# Patient Record
Sex: Female | Born: 1985 | Race: Black or African American | Hispanic: No | Marital: Married | Smoking: Never smoker
Health system: Southern US, Community
[De-identification: ages and names within clinical notes are randomized; demographics above are authoritative.]

## PROBLEM LIST (undated history)

## (undated) ENCOUNTER — Inpatient Hospital Stay: Payer: Self-pay

## (undated) ENCOUNTER — Inpatient Hospital Stay (HOSPITAL_COMMUNITY): Payer: Self-pay

## (undated) DIAGNOSIS — R87619 Unspecified abnormal cytological findings in specimens from cervix uteri: Secondary | ICD-10-CM

## (undated) DIAGNOSIS — R7611 Nonspecific reaction to tuberculin skin test without active tuberculosis: Secondary | ICD-10-CM

## (undated) DIAGNOSIS — J45909 Unspecified asthma, uncomplicated: Secondary | ICD-10-CM

## (undated) HISTORY — DX: Nonspecific reaction to tuberculin skin test without active tuberculosis: R76.11

## (undated) HISTORY — PX: FOOT SURGERY: SHX648

## (undated) HISTORY — DX: Unspecified abnormal cytological findings in specimens from cervix uteri: R87.619

## (undated) HISTORY — PX: NO PAST SURGERIES: SHX2092

---
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
## 2004-11-23 ENCOUNTER — Emergency Department: Payer: Self-pay | Admitting: Emergency Medicine

## 2005-11-27 ENCOUNTER — Emergency Department: Payer: Self-pay | Admitting: Emergency Medicine

## 2006-09-28 ENCOUNTER — Emergency Department: Payer: Self-pay | Admitting: Internal Medicine

## 2006-10-07 DIAGNOSIS — R7611 Nonspecific reaction to tuberculin skin test without active tuberculosis: Secondary | ICD-10-CM

## 2006-10-07 HISTORY — DX: Nonspecific reaction to tuberculin skin test without active tuberculosis: R76.11

## 2006-10-25 ENCOUNTER — Emergency Department: Payer: Self-pay | Admitting: Emergency Medicine

## 2007-03-17 ENCOUNTER — Ambulatory Visit (HOSPITAL_COMMUNITY): Admission: RE | Admit: 2007-03-17 | Discharge: 2007-03-17 | Payer: Self-pay | Admitting: Family Medicine

## 2007-04-08 ENCOUNTER — Observation Stay: Payer: Self-pay | Admitting: Obstetrics and Gynecology

## 2007-05-04 ENCOUNTER — Observation Stay: Payer: Self-pay

## 2007-06-08 ENCOUNTER — Observation Stay: Payer: Self-pay | Admitting: Obstetrics and Gynecology

## 2007-06-09 ENCOUNTER — Observation Stay: Payer: Self-pay | Admitting: Obstetrics and Gynecology

## 2007-06-10 ENCOUNTER — Observation Stay: Payer: Self-pay

## 2007-07-24 ENCOUNTER — Observation Stay: Payer: Self-pay

## 2007-08-13 ENCOUNTER — Observation Stay: Payer: Self-pay | Admitting: Obstetrics and Gynecology

## 2007-08-14 ENCOUNTER — Inpatient Hospital Stay: Payer: Self-pay | Admitting: Unknown Physician Specialty

## 2007-11-07 ENCOUNTER — Emergency Department: Payer: Self-pay | Admitting: Emergency Medicine

## 2008-09-14 ENCOUNTER — Emergency Department: Payer: Self-pay | Admitting: Emergency Medicine

## 2008-12-19 ENCOUNTER — Ambulatory Visit: Payer: Self-pay | Admitting: Family Medicine

## 2009-03-09 ENCOUNTER — Emergency Department: Payer: Self-pay | Admitting: Internal Medicine

## 2009-04-28 ENCOUNTER — Emergency Department: Payer: Self-pay | Admitting: Emergency Medicine

## 2009-10-17 ENCOUNTER — Emergency Department: Payer: Self-pay | Admitting: Emergency Medicine

## 2009-10-19 ENCOUNTER — Emergency Department: Payer: Self-pay | Admitting: Emergency Medicine

## 2010-03-26 ENCOUNTER — Emergency Department: Payer: Self-pay | Admitting: Emergency Medicine

## 2010-03-29 ENCOUNTER — Ambulatory Visit: Payer: Self-pay | Admitting: Internal Medicine

## 2010-11-16 ENCOUNTER — Emergency Department: Payer: Self-pay | Admitting: Emergency Medicine

## 2010-12-26 ENCOUNTER — Emergency Department: Payer: Self-pay | Admitting: Emergency Medicine

## 2012-07-26 ENCOUNTER — Emergency Department: Payer: Self-pay | Admitting: Emergency Medicine

## 2012-08-04 ENCOUNTER — Emergency Department: Payer: Self-pay | Admitting: Emergency Medicine

## 2012-09-07 ENCOUNTER — Emergency Department: Payer: Self-pay | Admitting: Emergency Medicine

## 2012-09-07 LAB — URINALYSIS, COMPLETE
Bilirubin,UR: NEGATIVE
Nitrite: NEGATIVE
Ph: 7 (ref 4.5–8.0)
Protein: >=500
RBC,UR: 4204 /HPF (ref 0–5)
Specific Gravity: 1.023 (ref 1.003–1.030)
WBC UR: 2916 /HPF (ref 0–5)

## 2012-09-07 LAB — PREGNANCY, URINE: Pregnancy Test, Urine: NEGATIVE m[IU]/mL

## 2012-09-09 LAB — URINE CULTURE

## 2013-06-27 ENCOUNTER — Emergency Department: Payer: Self-pay | Admitting: Emergency Medicine

## 2013-06-27 LAB — CBC WITH DIFFERENTIAL/PLATELET
Basophil #: 0 10*3/uL (ref 0.0–0.1)
Basophil %: 0.7 %
Eosinophil #: 0.1 10*3/uL (ref 0.0–0.7)
Lymphocyte %: 24.6 %
Monocyte #: 0.9 x10 3/mm (ref 0.2–0.9)
Neutrophil #: 3.5 10*3/uL (ref 1.4–6.5)
Platelet: 277 10*3/uL (ref 150–440)
RBC: 3.91 10*6/uL (ref 3.80–5.20)
RDW: 13 % (ref 11.5–14.5)

## 2013-06-27 LAB — COMPREHENSIVE METABOLIC PANEL
Albumin: 3.9 g/dL (ref 3.4–5.0)
BUN: 10 mg/dL (ref 7–18)
Bilirubin,Total: 0.4 mg/dL (ref 0.2–1.0)
Calcium, Total: 8.5 mg/dL (ref 8.5–10.1)
Co2: 29 mmol/L (ref 21–32)
Creatinine: 0.71 mg/dL (ref 0.60–1.30)
EGFR (African American): >60
Glucose: 83 mg/dL (ref 65–99)
Osmolality: 278 (ref 275–301)
Potassium: 3.5 mmol/L (ref 3.5–5.1)
SGOT(AST): 37 U/L (ref 15–37)
SGPT (ALT): 56 U/L (ref 12–78)
Sodium: 140 mmol/L (ref 136–145)
Total Protein: 6.6 g/dL (ref 6.4–8.2)

## 2013-06-27 LAB — URINALYSIS, COMPLETE
Bilirubin,UR: NEGATIVE
Glucose,UR: NEGATIVE mg/dL (ref 0–75)
Ketone: NEGATIVE
Nitrite: NEGATIVE
Ph: 6 (ref 4.5–8.0)
Protein: NEGATIVE
Specific Gravity: 1.025 (ref 1.003–1.030)
Squamous Epithelial: 17
WBC UR: 4 /HPF (ref 0–5)

## 2013-09-15 LAB — OB RESULTS CONSOLE HIV ANTIBODY (ROUTINE TESTING): HIV: NONREACTIVE

## 2013-09-15 LAB — OB RESULTS CONSOLE ANTIBODY SCREEN: Antibody Screen: NEGATIVE

## 2013-09-15 LAB — OB RESULTS CONSOLE HGB/HCT, BLOOD
HEMATOCRIT: 40 %
Hemoglobin: 13.6 g/dL

## 2013-09-15 LAB — OB RESULTS CONSOLE GC/CHLAMYDIA
CHLAMYDIA, DNA PROBE: NEGATIVE
GC PROBE AMP, GENITAL: NEGATIVE

## 2013-09-15 LAB — OB RESULTS CONSOLE HEPATITIS B SURFACE ANTIGEN: Hepatitis B Surface Ag: NEGATIVE

## 2013-09-15 LAB — OB RESULTS CONSOLE ABO/RH: RH Type: POSITIVE

## 2013-09-15 LAB — OB RESULTS CONSOLE VARICELLA ZOSTER ANTIBODY, IGG: VARICELLA IGG: IMMUNE

## 2013-09-15 LAB — OB RESULTS CONSOLE RUBELLA ANTIBODY, IGM: Rubella: IMMUNE

## 2013-09-15 LAB — OB RESULTS CONSOLE RPR: RPR: NONREACTIVE

## 2013-10-07 NOTE — L&D Delivery Note (Signed)
Delivery Note At 6:29 PM a viable female was delivered via Vaginal, Spontaneous Delivery (Presentation: Right Occiput Anterior).  APGAR: 9, 10; weight TBD.   Placenta status: Intact, Spontaneous Pathology.  Cord: 3 vessels with the following complications: None.    Anesthesia: Epidural  Episiotomy: None Lacerations: None Suture Repair: na Est. Blood Loss (mL): 350cc  Mom to postpartum.  Baby to Couplet care / Skin to Skin.  Pt pushed with good maternal effort to deliver a liveborn female via NSVD with spontaneous cry.  Tight shoulders to deliver but no dystocia.  Baby placed on maternal abdomen.  Delayed cord clamping performed.  Cord cut by FOB.  Placenta delivered intact with 3V cord via traction and pitocin.  No tears. No complications.  Mom and baby to postpartum.  Meir Elwood L 04/10/2014, 6:46 PM

## 2013-10-29 ENCOUNTER — Emergency Department: Payer: Self-pay | Admitting: Emergency Medicine

## 2013-12-15 ENCOUNTER — Encounter: Payer: Self-pay | Admitting: *Deleted

## 2013-12-15 DIAGNOSIS — O09219 Supervision of pregnancy with history of pre-term labor, unspecified trimester: Principal | ICD-10-CM

## 2013-12-15 DIAGNOSIS — J45909 Unspecified asthma, uncomplicated: Secondary | ICD-10-CM

## 2013-12-15 DIAGNOSIS — N939 Abnormal uterine and vaginal bleeding, unspecified: Secondary | ICD-10-CM | POA: Insufficient documentation

## 2013-12-15 DIAGNOSIS — R87619 Unspecified abnormal cytological findings in specimens from cervix uteri: Secondary | ICD-10-CM

## 2013-12-15 DIAGNOSIS — R634 Abnormal weight loss: Secondary | ICD-10-CM | POA: Insufficient documentation

## 2013-12-15 DIAGNOSIS — Z91419 Personal history of unspecified adult abuse: Secondary | ICD-10-CM | POA: Insufficient documentation

## 2013-12-15 DIAGNOSIS — O09899 Supervision of other high risk pregnancies, unspecified trimester: Secondary | ICD-10-CM | POA: Insufficient documentation

## 2013-12-15 HISTORY — DX: Unspecified abnormal cytological findings in specimens from cervix uteri: R87.619

## 2013-12-16 ENCOUNTER — Emergency Department (HOSPITAL_COMMUNITY)
Admission: EM | Admit: 2013-12-16 | Discharge: 2013-12-16 | Disposition: A | Discharge status: Home / Self Care | Payer: Medicaid Other | Attending: Emergency Medicine | Admitting: Emergency Medicine

## 2013-12-16 ENCOUNTER — Encounter (HOSPITAL_COMMUNITY): Payer: Self-pay | Admitting: Emergency Medicine

## 2013-12-16 DIAGNOSIS — O09219 Supervision of pregnancy with history of pre-term labor, unspecified trimester: Secondary | ICD-10-CM

## 2013-12-16 DIAGNOSIS — Z79899 Other long term (current) drug therapy: Secondary | ICD-10-CM | POA: Insufficient documentation

## 2013-12-16 DIAGNOSIS — J3489 Other specified disorders of nose and nasal sinuses: Secondary | ICD-10-CM | POA: Insufficient documentation

## 2013-12-16 DIAGNOSIS — O09899 Supervision of other high risk pregnancies, unspecified trimester: Secondary | ICD-10-CM

## 2013-12-16 DIAGNOSIS — N949 Unspecified condition associated with female genital organs and menstrual cycle: Secondary | ICD-10-CM | POA: Insufficient documentation

## 2013-12-16 DIAGNOSIS — Z8751 Personal history of pre-term labor: Secondary | ICD-10-CM | POA: Insufficient documentation

## 2013-12-16 DIAGNOSIS — R143 Flatulence: Secondary | ICD-10-CM

## 2013-12-16 DIAGNOSIS — R141 Gas pain: Secondary | ICD-10-CM | POA: Insufficient documentation

## 2013-12-16 DIAGNOSIS — R142 Eructation: Secondary | ICD-10-CM | POA: Insufficient documentation

## 2013-12-16 DIAGNOSIS — O9989 Other specified diseases and conditions complicating pregnancy, childbirth and the puerperium: Secondary | ICD-10-CM | POA: Insufficient documentation

## 2013-12-16 DIAGNOSIS — Z3480 Encounter for supervision of other normal pregnancy, unspecified trimester: Secondary | ICD-10-CM | POA: Insufficient documentation

## 2013-12-16 NOTE — ED Notes (Signed)
Pt reports being approx [redacted] weeks pregnant, no prenatal care yet during this pregnancy. Reports this am had episode of pelvic pain, but has not felt any fetal movement today. Denies vaginal bleeding or discharge.

## 2013-12-16 NOTE — Discharge Instructions (Signed)
Followup with local OB/GYN [facility]. Continue your prenatal vitamins. Return for any newer worse symptoms. Here today fetal movement is good no signs of any stress or difficulty for the baby.   Emergency Department Resource Guide 1) Find a Doctor and Pay Out of Pocket Although you won't have to find out who is covered by your insurance plan, it is a good idea to ask around and get recommendations. You will then need to call the office and see if the doctor you have chosen will accept you as a new patient and what types of options they offer for patients who are self-pay. Some doctors offer discounts or will set up payment plans for their patients who do not have insurance, but you will need to ask so you aren't surprised when you get to your appointment.  2) Contact Your Local Health Department Not all health departments have doctors that can see patients for sick visits, but many do, so it is worth a call to see if yours does. If you don't know where your local health department is, you can check in your phone book. The CDC also has a tool to help you locate your state's health department, and many state websites also have listings of all of their local health departments.  3) Find a Walk-in Clinic If your illness is not likely to be very severe or complicated, you may want to try a walk in clinic. These are popping up all over the country in pharmacies, drugstores, and shopping centers. They're usually staffed by nurse practitioners or physician assistants that have been trained to treat common illnesses and complaints. They're usually fairly quick and inexpensive. However, if you have serious medical issues or chronic medical problems, these are probably not your best option.  No Primary Care Doctor: - Call Health Connect at  [phone] - they can help you locate a primary care doctor that  accepts your insurance, provides certain services, etc. - Physician Referral Service-  [phone]  Chronic Pain Problems: Organization         Address  Phone   Notes  [facility]  [phone] Patients need to be referred by their primary care doctor.   Medication Assistance: Organization         Address  Phone   Notes  [facility] Medication Revision [facility] [address] [place], [place] [address] [phone] --Must be a resident of [place] -- Must have NO insurance coverage whatsoever (no Medicaid/ Medicare, etc.) -- The pt. MUST have a primary care doctor that directs their care regularly and follows them in the community   MedAssist  [phone]   [employer]  [phone]    Agencies that provide inexpensive medical care: Organization         Address  Phone   Notes  [facility]  [phone]   [facility]    [phone]   [facility] [address] [place], [place] [address] [phone]   [facility] [address], [place] [phone]   [facility]anned Parenthood    [phone]   [facility]    [phone]   [facility]  [address],  Phone:  [phone], Fax:  [phone] Hours of Operation:  9 am - 6 pm, M-F.  Also accepts Medicaid/Medicare and self-pay.  [place]  [facility] for [place], [address], [place] Phone: [phone], Fax: [phone]. Hours of Operation:  8:30 am - 5:30 pm, M-F.  Also accepts Medicaid and self-pay.  [facility] [place] [address] [place] Phone: [phone]   [place], [place], [place] [phone], Ext. [phone] Mondays & Thursdays: 7-9 AM.  First 15 patients are seen on a first come, first serve basis.    [place] Providers:  Organization         Address  Phone   Notes  [facility] [address],  [place] [phone] Also accepts self-pay patients.  [facility] [id number] [place], [place]  [phone]   [place], [address], [place] [phone]   [facility] [address] [place] [phone]   Lucianne Lei [address], [place]   [phone] Only accepts [place] Access [place] patients after they have their name applied to their card.   Self-Pay (no insurance) in [facility]:  Organization         Address  Phone   Notes  Sickle Cell Patients, [facility] [place] [phone]   [facility] [place] [phone]   [facility] [place]  [place], [place], [place] [phone]   [facility]/Dr. Osei-Bonsu  [address], [place] or [address], [place] [phone] Phone number for both [place] and [place] locations is the same.  [facility] [address] [place] [phone]   [facility] [address], [place] or [address] [phone]   [facility] [address], [place] [phone], phone; [phone], fax Sees patients 1st and 3rd Saturday of every month.  Must not qualify for public or private insurance (i.e. Medicaid, Medicare, [facility] Health Choice, Veterans' Benefits)  Household income should be no more than 200% of the poverty level The clinic cannot treat you if you are pregnant or think you are pregnant  Sexually transmitted diseases are not treated at the clinic.    Dental Care: Organization         Address  Phone  Notes  [facility] Department of [facility] [place] [phone] Accepts children up to age 102 who are enrolled in  [place] or [place]; pregnant women with a Medicaid card; and children who have applied for Medicaid or [place] Health Choice, but were declined, whose parents can pay a reduced fee at time of service.  [facility] Department of [facility]  [address], [place] [phone] Accepts children up to age 74 who are enrolled in [place] or [place]; pregnant women with a Medicaid card; and children who have applied for Medicaid or [place] Health Choice, but were declined, whose parents can pay a reduced fee at time of service.  [facility] Access PRO[facility]  [place] [phone] Patients are seen by appointment only. Walk-ins are not accepted. [place] will see patients 40 years of age and older. Monday - Tuesday (8am-5pm) Most Wednesdays (8:30-5pm) $30 per visit, cash  only  [facility] Access PROGRAM  [address], [facility] [phone] Patients are seen by appointment only. Walk-ins are not accepted. [place] will see patients 14 years of age and older. One Wednesday Evening (Monthly: Volunteer Based).  $30 per visit, cash only  [place]  [phone] for adults; Children under age 48, call Graduate Pediatric Dentistry at [phone]. Children aged 39-14, please call [phone] to request a pediatric application.  Dental services are provided in all areas of dental care including fillings, crowns and bridges, complete and partial dentures, implants, gum treatment, root canals, and extractions. Preventive care is also provided. Treatment is provided to both adults and children. Patients are selected via a lottery and there is often a waiting list.   [facility] [address] [place]  [phone] [email or web address]   [facility] [address] [place], [place] [phone], Ext. [phone] Second and Fourth Thursday of each month, opens at 6:30  AM; Clinic ends at 9 AM.  Patients are seen on a first-come first-served basis, and a limited number are seen during each clinic.   [facility]  [address] Hillard Danker [place], [place] [phone]   Eligibility Requirements You must have lived in [place], [place], or [place] counties for at least the last three months.   You cannot be eligible for state or federal sponsored [employer], including [employer], [place], or [employer].   You generally cannot be eligible for healthcare insurance through your employer.    How to apply: Eligibility screenings are held every Tuesday and Wednesday afternoon from 1:00 pm until 4:00 pm. You do not need an appointment for the interview!  [facility] [address], [place], [place]   [place]  [place] Department  [place]  [phone]    Behavioral Health Resources in the Community: Intensive Outpatient Programs Organization         Address  Phone  Notes  [place]. [address], [place], [place] [phone]   [facility] [address] [place], [place]   ADS: Alcohol & Drug Svcs [address], [place], [place]   [place] [address],  [place], [place] or [phone]   Substance Abuse Resources Organization         Address  Phone  Notes  Alcohol and Drug Services  [phone]   [place]  [phone]   The [place]   Chinita Pester  [phone]   Residential & Outpatient Substance Abuse Program  [phone]   Psychological Services Organization         Address  Phone  Notes  [facility] [place]  [place]  [phone]   [place] [address], [place] or  [phone]    Mobile Crisis Teams Organization         Address  Phone  Notes  [facility], Mobile Crisis Care Unit  [phone]   [facility]  [address] [place], [place]   [place] Levels [address] [place] [phone]    Self-Help/Support Groups Organization         Address  Phone  Notes  [facility]ental [facility] of [place] - variety of support groups  [place] Call for more information  Narcotics Anonymous (NA), Caring [facility] [address], [employer] [place]  2 meetings at this location   Special educational needs teacher         Address  Phone  Notes  ASAP Residential Treatment [place],    [place]  [phone]   [facility]  [address] [place] [id number], [place], [place]   [place], [place] [phone] Admissions: 8am-3pm M-F  Incentives Substance [place] [address]    [place], [place] [phone]   [facility] [address] [place], [place], [place]   [facility] [address],  [place], [place]   [facility] Programs - Intensive Outpatient [address], [place], [place]   [facility] ([place] [address].,  [place], [place] [phone] or [phone]   [facility]idential Treatment Services (RTS) [address] [place], [place] Accepts Medicaid  [facility] [place] [address],  [place] [phone] Substance Abuse/Addiction Treatment   [facility] Organization         Address  Phone  Notes  [facility]  [phone]   Domenic Schwab, PhD [address] Arlis Porta [place], [place]   [phone] or [phone]   [place] and [place], [place] [phone]   [facility] [address],  [place], [place] [phone] Insurance/Medicaid/sponsorship through [facility] and Families [address], [address] [place]                                    [place], [place] [phone] [place] [address] [place], [place] [phone]    Dr. Adele Schilder  [phone]   [facility] Dept. 1) [address], [place] 2) [place] 3)  [place] [address], [place] [phone]  [phone]   [place] [phone] or [phone] (After Hours)

## 2013-12-16 NOTE — Progress Notes (Signed)
OB Rapid Response: called for pt new to area with decrease fetal movement. Ext monitor applied with FH of 145, spont accels noted, no decels. No c/o contractions, ROM, vag bleeding or back pain. [facility] for December 27, 2013. Pt now reports feeling movement.

## 2013-12-16 NOTE — ED Notes (Signed)
OB rapid response nurse advised the patient was cleared obstetrically and could be discharged.

## 2013-12-16 NOTE — ED Notes (Signed)
OB rapid response nurse arrived and I gave her report.

## 2013-12-16 NOTE — ED Provider Notes (Signed)
CSN: [id number]     Arrival date & time 12/16/13  1730 History   First MD Initiated Contact with Patient 12/16/13 1826     Chief Complaint  Patient presents with  . Decreased Fetal Movement     (Consider location/radiation/quality/duration/timing/severity/associated sxs/prior Treatment) The history is provided by the patient.   28 year old female here for loss of fetal movement here today and some episodes of abdominal pelvic pain. No vaginal bleeding or discharge. Patient is approximately [redacted] weeks pregnant. Initially was thought patient had a prenatal care but she has [place] in the [place] area. Recently moved to this area. Patient is gravida 3 para 2. Patient states her due date is in July of 2015.  History reviewed. No pertinent past medical history. History reviewed. No pertinent past surgical history. History reviewed. No pertinent family history. History  Substance Use Topics  . Smoking status: Never Smoker   . Smokeless tobacco: Not on file  . Alcohol Use: No   OB History   Grav Para Term Preterm Abortions TAB SAB Ect Mult Living   3 2 1 1      2      Review of Systems  Constitutional: Negative for fever.  HENT: Positive for congestion.   Eyes: Negative for redness and visual disturbance.  Respiratory: Negative for shortness of breath.   Cardiovascular: Negative for chest pain.  Gastrointestinal: Positive for abdominal pain.  Genitourinary: Positive for pelvic pain. Negative for dysuria and vaginal bleeding.  Musculoskeletal: Negative for back pain.  Skin: Negative for rash.  Neurological: Negative for headaches.  Hematological: Does not bruise/bleed easily.  Psychiatric/Behavioral: Negative for confusion.      Allergies  Review of patient's allergies indicates no known allergies.  Home Medications   Current Outpatient Rx  Name  Route  Sig  Dispense  Refill  . Prenatal Vit-Fe Fumarate-FA (MULTIVITAMIN-PRENATAL) 27-0.8 MG TABS tablet   Oral   Take 1  tablet by mouth daily at 12 noon.          BP 120/59  Pulse 87  Temp(Src) 98.7 F (37.1 C) (Oral)  Resp 18  SpO2 100%  LMP 07/11/2013 Physical Exam  Nursing note and vitals reviewed. Constitutional: She is oriented to person, place, and time. She appears well-developed and well-nourished. No distress.  HENT:  Head: Normocephalic and atraumatic.  Mouth/Throat: Oropharynx is clear and moist.  Eyes: Conjunctivae and EOM are normal. Pupils are equal, round, and reactive to light.  Neck: Normal range of motion.  Cardiovascular: Normal rate, regular rhythm and normal heart sounds.   Pulmonary/Chest: Effort normal and breath sounds normal. No respiratory distress.  Abdominal: Soft. Bowel sounds are normal. She exhibits distension. There is no tenderness.  Consistent with a gravid uterus uterus is slightly above the umbilicus. Nontender. No palpable contractions.  Musculoskeletal: Normal range of motion. She exhibits no edema.  Neurological: She is alert and oriented to person, place, and time. No cranial nerve deficit. She exhibits normal muscle tone. Coordination normal.  Skin: Skin is warm. No rash noted. She is not diaphoretic.    ED Course  Procedures (including critical care time) Labs Review Labs Reviewed - No data to display Imaging Review No results found.   EKG Interpretation None      MDM   Final diagnoses:  Hx of preterm delivery, currently pregnant    Patient is due date is 04/17/2014. Patient [place] prenatal care in the [place] area. Initially we thought she had had no prenatal care. Patient evaluated  by myself and the OB nurse. Patient had fetal monitoring no evidence of any fetal distress fetal heart tones are good. Patient given referral to OB in the local area as well as a resource guide. Patient has had prenatal basic labs done in the past patient is on prenatal vitamins. Patient in no acute distress nontoxic.    Shelda Jakes, MD 12/16/13  [phone]

## 2013-12-16 NOTE — Progress Notes (Signed)
Spoke to Dr Macon LargeAnyanwu on the phone. EFM is Cat 1, pt is cleared obstetrically. To keep scheduled appointment at clinic 12/27/13.

## 2013-12-16 NOTE — ED Notes (Signed)
Patient is alert and orientedx4.  Patient was explained discharge instructions and they understood them with no questions.   

## 2013-12-27 ENCOUNTER — Ambulatory Visit (INDEPENDENT_AMBULATORY_CARE_PROVIDER_SITE_OTHER): Payer: Medicaid Other | Admitting: Family Medicine

## 2013-12-27 ENCOUNTER — Encounter: Payer: Self-pay | Admitting: Family Medicine

## 2013-12-27 VITALS — BP 126/73 | Temp 96.6°F | Ht 62.0 in | Wt 136.5 lb

## 2013-12-27 DIAGNOSIS — O09899 Supervision of other high risk pregnancies, unspecified trimester: Secondary | ICD-10-CM

## 2013-12-27 DIAGNOSIS — O09219 Supervision of pregnancy with history of pre-term labor, unspecified trimester: Secondary | ICD-10-CM

## 2013-12-27 DIAGNOSIS — J45909 Unspecified asthma, uncomplicated: Secondary | ICD-10-CM

## 2013-12-27 DIAGNOSIS — Z3480 Encounter for supervision of other normal pregnancy, unspecified trimester: Secondary | ICD-10-CM

## 2013-12-27 DIAGNOSIS — Z3490 Encounter for supervision of normal pregnancy, unspecified, unspecified trimester: Secondary | ICD-10-CM | POA: Insufficient documentation

## 2013-12-27 LAB — POCT URINALYSIS DIP (DEVICE)
Bilirubin Urine: NEGATIVE
GLUCOSE, UA: NEGATIVE mg/dL
Hgb urine dipstick: NEGATIVE
KETONES UR: NEGATIVE mg/dL
LEUKOCYTES UA: NEGATIVE
Nitrite: NEGATIVE
PH: 7 (ref 5.0–8.0)
Protein, ur: NEGATIVE mg/dL
Specific Gravity, Urine: 1.025 (ref 1.005–1.030)
Urobilinogen, UA: 0.2 mg/dL (ref 0.0–1.0)

## 2013-12-27 MED ORDER — ALBUTEROL SULFATE HFA 108 (90 BASE) MCG/ACT IN AERS: 2.0000 | INHALATION_SPRAY | Freq: Four times a day (QID) | RESPIRATORY_TRACT | Status: DC | PRN

## 2013-12-27 NOTE — Patient Instructions (Signed)
Second Trimester of Pregnancy The second trimester is from week 13 through week 28, months 4 through 6. The second trimester is often a time when you feel your best. Your body has also adjusted to being pregnant, and you begin to feel better physically. Usually, morning sickness has lessened or quit completely, you may have more energy, and you may have an increase in appetite. The second trimester is also a time when the fetus is growing rapidly. At the end of the sixth month, the fetus is about 9 inches long and weighs about 1 pounds. You will likely begin to feel the baby move (quickening) between 18 and 20 weeks of the pregnancy. BODY CHANGES Your body goes through many changes during pregnancy. The changes vary from woman to woman.   Your weight will continue to increase. You will notice your lower abdomen bulging out.  You may begin to get stretch marks on your hips, abdomen, and breasts.  You may develop headaches that can be relieved by medicines approved by your caregiver.  You may urinate more often because the fetus is pressing on your bladder.  You may develop or continue to have heartburn as a result of your pregnancy.  You may develop constipation because certain hormones are causing the muscles that push waste through your intestines to slow down.  You may develop hemorrhoids or swollen, bulging veins (varicose veins).  You may have back pain because of the weight gain and pregnancy hormones relaxing your joints between the bones in your pelvis and as a result of a shift in weight and the muscles that support your balance.  Your breasts will continue to grow and be tender.  Your gums may bleed and may be sensitive to brushing and flossing.  Dark spots or blotches (chloasma, mask of pregnancy) may develop on your face. This will likely fade after the baby is born.  A dark line from your belly button to the pubic area (linea nigra) may appear. This will likely fade after the  baby is born. WHAT TO EXPECT AT YOUR PRENATAL VISITS During a routine prenatal visit:  You will be weighed to make sure you and the fetus are growing normally.  Your blood pressure will be taken.  Your abdomen will be measured to track your baby's growth.  The fetal heartbeat will be listened to.  Any test results from the previous visit will be discussed. Your caregiver may ask you:  How you are feeling.  If you are feeling the baby move.  If you have had any abnormal symptoms, such as leaking fluid, bleeding, severe headaches, or abdominal cramping.  If you have any questions. Other tests that may be performed during your second trimester include:  Blood tests that check for:  Low iron levels (anemia).  Gestational diabetes (between 24 and 28 weeks).  Rh antibodies.  Urine tests to check for infections, diabetes, or protein in the urine.  An ultrasound to confirm the proper growth and development of the baby.  An amniocentesis to check for possible genetic problems.  Fetal screens for spina bifida and Down syndrome. HOME CARE INSTRUCTIONS   Avoid all smoking, herbs, alcohol, and unprescribed drugs. These chemicals affect the formation and growth of the baby.  Follow your caregiver's instructions regarding medicine use. There are medicines that are either safe or unsafe to take during pregnancy.  Exercise only as directed by your caregiver. Experiencing uterine cramps is a good sign to stop exercising.  Continue to eat regular,   healthy meals.  Wear a good support bra for breast tenderness.  Do not use hot tubs, steam rooms, or saunas.  Wear your seat belt at all times when driving.  Avoid raw meat, uncooked cheese, cat litter boxes, and soil used by cats. These carry germs that can cause birth defects in the baby.  Take your prenatal vitamins.  Try taking a stool softener (if your caregiver approves) if you develop constipation. Eat more high-fiber foods,  such as fresh vegetables or fruit and whole grains. Drink plenty of fluids to keep your urine clear or pale yellow.  Take warm sitz baths to soothe any pain or discomfort caused by hemorrhoids. Use hemorrhoid cream if your caregiver approves.  If you develop varicose veins, wear support hose. Elevate your feet for 15 minutes, 3 4 times a day. Limit salt in your diet.  Avoid heavy lifting, wear low heel shoes, and practice good posture.  Rest with your legs elevated if you have leg cramps or low back pain.  Visit your dentist if you have not gone yet during your pregnancy. Use a soft toothbrush to brush your teeth and be gentle when you floss.  A sexual relationship may be continued unless your caregiver directs you otherwise.  Continue to go to all your prenatal visits as directed by your caregiver. SEEK MEDICAL CARE IF:   You have dizziness.  You have mild pelvic cramps, pelvic pressure, or nagging pain in the abdominal area.  You have persistent nausea, vomiting, or diarrhea.  You have a bad smelling vaginal discharge.  You have pain with urination. SEEK IMMEDIATE MEDICAL CARE IF:   You have a fever.  You are leaking fluid from your vagina.  You have spotting or bleeding from your vagina.  You have severe abdominal cramping or pain.  You have rapid weight gain or loss.  You have shortness of breath with chest pain.  You notice sudden or extreme swelling of your face, hands, ankles, feet, or legs.  You have not felt your baby move in over an hour.  You have severe headaches that do not go away with medicine.  You have vision changes. Document Released: 09/17/2001 Document Revised: 05/26/2013 Document Reviewed: 11/24/2012 ExitCare Patient Information 2014 [employer].  
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
## 2013-12-27 NOTE — Addendum Note (Signed)
Addended by: Minta BalsamDOM, Yazlin Ekblad R on: 12/27/2013 12:48 PM   Modules accepted: Orders

## 2013-12-27 NOTE — Progress Notes (Addendum)
Nutrition note: 1st visit consult  Pt has gained 18.5# @ 2465w1d, which is slightly > expected. Pt reports eating 3 meals & 1-2 snacks/d. Pt is taking a PNV. Pt reports having some heartburn, which causes N/V. Pt stated she started taking medication for heartburn yesterday & it is helping. NKFA. Pt received verbal & written education on general nutrition during pregnancy. Discussed tips for decreasing heartburn.  Discussed wt gain goals of 25-35# or 1#/wk. Pt agrees to continue taking PNV. [place] [facility] [place] Pt plans to BF. F/u as needed Blondell RevealLaura Shanikwa State, MS, RD, LDN, [facility]

## 2013-12-27 NOTE — Progress Notes (Signed)
U/S scheduled 01/03/14 at 330 pm.

## 2013-12-27 NOTE — Progress Notes (Deleted)
Nutrition note: 1st visit consult Pt has

## 2013-12-27 NOTE — Progress Notes (Signed)
Pulse- 97 Patient reports occasional problems with pelvic bone pain; last doctor visit 2/13; has not had ultrasound since early one

## 2014-01-03 ENCOUNTER — Ambulatory Visit (HOSPITAL_COMMUNITY)
Admission: RE | Admit: 2014-01-03 | Discharge: 2014-01-03 | Disposition: A | Discharge status: Home / Self Care | Payer: Medicaid Other | Source: Ambulatory Visit | Attending: Family Medicine | Admitting: Family Medicine

## 2014-01-03 DIAGNOSIS — Z8751 Personal history of pre-term labor: Secondary | ICD-10-CM | POA: Insufficient documentation

## 2014-01-03 DIAGNOSIS — O09899 Supervision of other high risk pregnancies, unspecified trimester: Secondary | ICD-10-CM

## 2014-01-03 DIAGNOSIS — Z3689 Encounter for other specified antenatal screening: Secondary | ICD-10-CM | POA: Insufficient documentation

## 2014-01-03 DIAGNOSIS — O09219 Supervision of pregnancy with history of pre-term labor, unspecified trimester: Secondary | ICD-10-CM

## 2014-01-03 DIAGNOSIS — J45909 Unspecified asthma, uncomplicated: Secondary | ICD-10-CM

## 2014-01-10 ENCOUNTER — Encounter: Payer: Self-pay | Admitting: Family Medicine

## 2014-01-10 ENCOUNTER — Telehealth: Payer: Self-pay

## 2014-01-10 NOTE — Telephone Encounter (Signed)
Pt called and stated that she had an US and it showed a later dating.  Does she need a sooner appt to be able to drink sugar testing? Called pt and informed her that she can do her glucose testing on her appt on 01/27/14 in which the provider as well would evaluate the US and determine her dating.  Pt stated "ok".  Pt then asked about a yellowish discharge that she is having.  Pt informed me that it was not itchy or it did not have an odor.  I advised pt that it is normal discharge, but if her symptoms change that the provider can also evaluate that on her visit.  Pt agreed.

## 2014-01-27 ENCOUNTER — Ambulatory Visit (INDEPENDENT_AMBULATORY_CARE_PROVIDER_SITE_OTHER): Payer: Medicaid Other | Admitting: Family Medicine

## 2014-01-27 ENCOUNTER — Encounter: Payer: Self-pay | Admitting: Family Medicine

## 2014-01-27 VITALS — BP 121/77 | Temp 97.8°F | Wt 140.6 lb

## 2014-01-27 DIAGNOSIS — O09219 Supervision of pregnancy with history of pre-term labor, unspecified trimester: Secondary | ICD-10-CM

## 2014-01-27 DIAGNOSIS — Z3490 Encounter for supervision of normal pregnancy, unspecified, unspecified trimester: Secondary | ICD-10-CM

## 2014-01-27 DIAGNOSIS — Z23 Encounter for immunization: Secondary | ICD-10-CM

## 2014-01-27 LAB — CBC
HCT: 34.4 % — ABNORMAL LOW (ref 36.0–46.0)
HEMOGLOBIN: 11.9 g/dL — AB (ref 12.0–15.0)
MCH: 33.1 pg (ref 26.0–34.0)
MCHC: 34.6 g/dL (ref 30.0–36.0)
MCV: 95.8 fL (ref 78.0–100.0)
PLATELETS: 237 10*3/uL (ref 150–400)
RBC: 3.59 MIL/uL — AB (ref 3.87–5.11)
RDW: 13 % (ref 11.5–15.5)
WBC: 11 10*3/uL — AB (ref 4.0–10.5)

## 2014-01-27 LAB — POCT URINALYSIS DIP (DEVICE)
BILIRUBIN URINE: NEGATIVE
Glucose, UA: NEGATIVE mg/dL
Ketones, ur: NEGATIVE mg/dL
NITRITE: NEGATIVE
PH: 6.5 (ref 5.0–8.0)
Protein, ur: NEGATIVE mg/dL
Specific Gravity, Urine: >=1.03 (ref 1.005–1.030)
Urobilinogen, UA: 0.2 mg/dL (ref 0.0–1.0)

## 2014-01-27 MED ORDER — FLUCONAZOLE 150 MG PO TABS: 150.0000 mg | ORAL_TABLET | Freq: Once | ORAL | Status: DC

## 2014-01-27 MED ORDER — PRENATAL 27-0.8 MG PO TABS: 1.0000 | ORAL_TABLET | Freq: Every day | ORAL | Status: DC

## 2014-01-27 MED ORDER — TETANUS-DIPHTH-ACELL PERTUSSIS 5-2.5-18.5 LF-MCG/0.5 IM SUSP: 0.5000 mL | Freq: Once | INTRAMUSCULAR | Status: DC

## 2014-01-27 NOTE — Progress Notes (Signed)
U/S scheduled 02/02/14 at 815 am.

## 2014-01-27 NOTE — Progress Notes (Signed)
P=96  28 week labs  Pt experiencing itchy discharge, needs prenatal vitamins reordered.

## 2014-01-27 NOTE — Patient Instructions (Signed)
Third Trimester of Pregnancy  The third trimester is from week 29 through week 42, months 7 through 9. The third trimester is a time when the fetus is growing rapidly. At the end of the ninth month, the fetus is about 20 inches in length and weighs 6 10 pounds.   BODY CHANGES  Your body goes through many changes during pregnancy. The changes vary from woman to woman.    Your weight will continue to increase. You can expect to gain 25 35 pounds (11 16 kg) by the end of the pregnancy.   You may begin to get stretch marks on your hips, abdomen, and breasts.   You may urinate more often because the fetus is moving lower into your pelvis and pressing on your bladder.   You may develop or continue to have heartburn as a result of your pregnancy.   You may develop constipation because certain hormones are causing the muscles that push waste through your intestines to slow down.   You may develop hemorrhoids or swollen, bulging veins (varicose veins).   You may have pelvic pain because of the weight gain and pregnancy hormones relaxing your joints between the bones in your pelvis. Back aches may result from over exertion of the muscles supporting your posture.   Your breasts will continue to grow and be tender. A yellow discharge may leak from your breasts called colostrum.   Your belly button may stick out.   You may feel short of breath because of your expanding uterus.   You may notice the fetus "dropping," or moving lower in your abdomen.   You may have a bloody mucus discharge. This usually occurs a few days to a week before labor begins.   Your cervix becomes thin and soft (effaced) near your due date.  WHAT TO EXPECT AT YOUR PRENATAL EXAMS   You will have prenatal exams every 2 weeks until week 36. Then, you will have weekly prenatal exams. During a routine prenatal visit:   You will be weighed to make sure you and the fetus are growing normally.   Your blood pressure is taken.   Your abdomen will be  measured to track your baby's growth.   The fetal heartbeat will be listened to.   Any test results from the previous visit will be discussed.   You may have a cervical check near your due date to see if you have effaced.  At around 36 weeks, your caregiver will check your cervix. At the same time, your caregiver will also perform a test on the secretions of the vaginal tissue. This test is to determine if a type of bacteria, Group B streptococcus, is present. Your caregiver will explain this further.  Your caregiver may ask you:   What your birth plan is.   How you are feeling.   If you are feeling the baby move.   If you have had any abnormal symptoms, such as leaking fluid, bleeding, severe headaches, or abdominal cramping.   If you have any questions.  Other tests or screenings that may be performed during your third trimester include:   Blood tests that check for low iron levels (anemia).   Fetal testing to check the health, activity level, and growth of the fetus. Testing is done if you have certain medical conditions or if there are problems during the pregnancy.  FALSE LABOR  You may feel small, irregular contractions that eventually go away. These are called Braxton Hicks contractions, or   false labor. Contractions may last for hours, days, or even weeks before true labor sets in. If contractions come at regular intervals, intensify, or become painful, it is best to be seen by your caregiver.   SIGNS OF LABOR    Menstrual-like cramps.   Contractions that are 5 minutes apart or less.   Contractions that start on the top of the uterus and spread down to the lower abdomen and back.   A sense of increased pelvic pressure or back pain.   A watery or bloody mucus discharge that comes from the vagina.  If you have any of these signs before the 37th week of pregnancy, call your caregiver right away. You need to go to the hospital to get checked immediately.  HOME CARE INSTRUCTIONS    Avoid all  smoking, herbs, alcohol, and unprescribed drugs. These chemicals affect the formation and growth of the baby.   Follow your caregiver's instructions regarding medicine use. There are medicines that are either safe or unsafe to take during pregnancy.   Exercise only as directed by your caregiver. Experiencing uterine cramps is a good sign to stop exercising.   Continue to eat regular, healthy meals.   Wear a good support bra for breast tenderness.   Do not use hot tubs, steam rooms, or saunas.   Wear your seat belt at all times when driving.   Avoid raw meat, uncooked cheese, cat litter boxes, and soil used by cats. These carry germs that can cause birth defects in the baby.   Take your prenatal vitamins.   Try taking a stool softener (if your caregiver approves) if you develop constipation. Eat more high-fiber foods, such as fresh vegetables or fruit and whole grains. Drink plenty of fluids to keep your urine clear or pale yellow.   Take warm sitz baths to soothe any pain or discomfort caused by hemorrhoids. Use hemorrhoid cream if your caregiver approves.   If you develop varicose veins, wear support hose. Elevate your feet for 15 minutes, 3 4 times a day. Limit salt in your diet.   Avoid heavy lifting, wear low heal shoes, and practice good posture.   Rest a lot with your legs elevated if you have leg cramps or low back pain.   Visit your dentist if you have not gone during your pregnancy. Use a soft toothbrush to brush your teeth and be gentle when you floss.   A sexual relationship may be continued unless your caregiver directs you otherwise.   Do not travel far distances unless it is absolutely necessary and only with the approval of your caregiver.   Take prenatal classes to understand, practice, and ask questions about the labor and delivery.   Make a trial run to the hospital.   Pack your hospital bag.   Prepare the baby's nursery.   Continue to go to all your prenatal visits as directed  by your caregiver.  SEEK MEDICAL CARE IF:   You are unsure if you are in labor or if your water has broken.   You have dizziness.   You have mild pelvic cramps, pelvic pressure, or nagging pain in your abdominal area.   You have persistent nausea, vomiting, or diarrhea.   You have a bad smelling vaginal discharge.   You have pain with urination.  SEEK IMMEDIATE MEDICAL CARE IF:    You have a fever.   You are leaking fluid from your vagina.   You have spotting or bleeding from your vagina.     You have severe abdominal cramping or pain.   You have rapid weight loss or gain.   You have shortness of breath with chest pain.   You notice sudden or extreme swelling of your face, hands, ankles, feet, or legs.   You have not felt your baby move in over an hour.   You have severe headaches that do not go away with medicine.   You have vision changes.  Document Released: 09/17/2001 Document Revised: 05/26/2013 Document Reviewed: 11/24/2012  [employer] Patient Information 2014 [employer].

## 2014-01-27 NOTE — Progress Notes (Signed)
U/S appointment changed to 02/14/14 at 115 pm.
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
## 2014-01-28 LAB — RPR

## 2014-01-28 LAB — HIV ANTIBODY (ROUTINE TESTING W REFLEX): HIV 1&2 Ab, 4th Generation: NONREACTIVE

## 2014-01-28 LAB — GLUCOSE TOLERANCE, 1 HOUR (50G) W/O FASTING: Glucose, 1 Hour GTT: 80 mg/dL (ref 70–140)

## 2014-02-02 ENCOUNTER — Ambulatory Visit (HOSPITAL_COMMUNITY): Payer: Medicaid Other

## 2014-02-10 ENCOUNTER — Ambulatory Visit (INDEPENDENT_AMBULATORY_CARE_PROVIDER_SITE_OTHER): Payer: Medicaid Other | Admitting: Obstetrics & Gynecology

## 2014-02-10 VITALS — BP 114/69 | HR 98 | Wt 139.2 lb

## 2014-02-10 DIAGNOSIS — Z3490 Encounter for supervision of normal pregnancy, unspecified, unspecified trimester: Secondary | ICD-10-CM

## 2014-02-10 DIAGNOSIS — O09899 Supervision of other high risk pregnancies, unspecified trimester: Secondary | ICD-10-CM

## 2014-02-10 DIAGNOSIS — Z3480 Encounter for supervision of other normal pregnancy, unspecified trimester: Secondary | ICD-10-CM

## 2014-02-10 DIAGNOSIS — O09219 Supervision of pregnancy with history of pre-term labor, unspecified trimester: Secondary | ICD-10-CM

## 2014-02-10 LAB — POCT URINALYSIS DIP (DEVICE)
Bilirubin Urine: NEGATIVE
Glucose, UA: NEGATIVE mg/dL
Hgb urine dipstick: NEGATIVE
KETONES UR: NEGATIVE mg/dL
LEUKOCYTES UA: NEGATIVE
Nitrite: NEGATIVE
PROTEIN: 30 mg/dL — AB
SPECIFIC GRAVITY, URINE: 1.025 (ref 1.005–1.030)
Urobilinogen, UA: 0.2 mg/dL (ref 0.0–1.0)
pH: 7 (ref 5.0–8.0)

## 2014-02-10 MED ORDER — PANTOPRAZOLE SODIUM 40 MG PO TBEC: 40.0000 mg | DELAYED_RELEASE_TABLET | Freq: Every day | ORAL | Status: DC

## 2014-02-10 NOTE — Progress Notes (Signed)
US f/u next week. Heartburn, will change to Protonix 40 mg UC were noted yest, none now

## 2014-02-10 NOTE — Progress Notes (Signed)
Patient reports that she had contractions that were 5 min apart for about 30 min.  Patient would like cervix to be checked today.

## 2014-02-10 NOTE — Patient Instructions (Signed)
Third Trimester of Pregnancy  The third trimester is from week 29 through week 42, months 7 through 9. The third trimester is a time when the fetus is growing rapidly. At the end of the ninth month, the fetus is about 20 inches in length and weighs 6 10 pounds.   BODY CHANGES  Your body goes through many changes during pregnancy. The changes vary from woman to woman.    Your weight will continue to increase. You can expect to gain 25 35 pounds (11 16 kg) by the end of the pregnancy.   You may begin to get stretch marks on your hips, abdomen, and breasts.   You may urinate more often because the fetus is moving lower into your pelvis and pressing on your bladder.   You may develop or continue to have heartburn as a result of your pregnancy.   You may develop constipation because certain hormones are causing the muscles that push waste through your intestines to slow down.   You may develop hemorrhoids or swollen, bulging veins (varicose veins).   You may have pelvic pain because of the weight gain and pregnancy hormones relaxing your joints between the bones in your pelvis. Back aches may result from over exertion of the muscles supporting your posture.   Your breasts will continue to grow and be tender. A yellow discharge may leak from your breasts called colostrum.   Your belly button may stick out.   You may feel short of breath because of your expanding uterus.   You may notice the fetus "dropping," or moving lower in your abdomen.   You may have a bloody mucus discharge. This usually occurs a few days to a week before labor begins.   Your cervix becomes thin and soft (effaced) near your due date.  WHAT TO EXPECT AT YOUR PRENATAL EXAMS   You will have prenatal exams every 2 weeks until week 36. Then, you will have weekly prenatal exams. During a routine prenatal visit:   You will be weighed to make sure you and the fetus are growing normally.   Your blood pressure is taken.   Your abdomen will be  measured to track your baby's growth.   The fetal heartbeat will be listened to.   Any test results from the previous visit will be discussed.   You may have a cervical check near your due date to see if you have effaced.  At around 36 weeks, your caregiver will check your cervix. At the same time, your caregiver will also perform a test on the secretions of the vaginal tissue. This test is to determine if a type of bacteria, Group B streptococcus, is present. Your caregiver will explain this further.  Your caregiver may ask you:   What your birth plan is.   How you are feeling.   If you are feeling the baby move.   If you have had any abnormal symptoms, such as leaking fluid, bleeding, severe headaches, or abdominal cramping.   If you have any questions.  Other tests or screenings that may be performed during your third trimester include:   Blood tests that check for low iron levels (anemia).   Fetal testing to check the health, activity level, and growth of the fetus. Testing is done if you have certain medical conditions or if there are problems during the pregnancy.  FALSE LABOR  You may feel small, irregular contractions that eventually go away. These are called Braxton Hicks contractions, or   false labor. Contractions may last for hours, days, or even weeks before true labor sets in. If contractions come at regular intervals, intensify, or become painful, it is best to be seen by your caregiver.   SIGNS OF LABOR    Menstrual-like cramps.   Contractions that are 5 minutes apart or less.   Contractions that start on the top of the uterus and spread down to the lower abdomen and back.   A sense of increased pelvic pressure or back pain.   A watery or bloody mucus discharge that comes from the vagina.  If you have any of these signs before the 37th week of pregnancy, call your caregiver right away. You need to go to the hospital to get checked immediately.  HOME CARE INSTRUCTIONS    Avoid all  smoking, herbs, alcohol, and unprescribed drugs. These chemicals affect the formation and growth of the baby.   Follow your caregiver's instructions regarding medicine use. There are medicines that are either safe or unsafe to take during pregnancy.   Exercise only as directed by your caregiver. Experiencing uterine cramps is a good sign to stop exercising.   Continue to eat regular, healthy meals.   Wear a good support bra for breast tenderness.   Do not use hot tubs, steam rooms, or saunas.   Wear your seat belt at all times when driving.   Avoid raw meat, uncooked cheese, cat litter boxes, and soil used by cats. These carry germs that can cause birth defects in the baby.   Take your prenatal vitamins.   Try taking a stool softener (if your caregiver approves) if you develop constipation. Eat more high-fiber foods, such as fresh vegetables or fruit and whole grains. Drink plenty of fluids to keep your urine clear or pale yellow.   Take warm sitz baths to soothe any pain or discomfort caused by hemorrhoids. Use hemorrhoid cream if your caregiver approves.   If you develop varicose veins, wear support hose. Elevate your feet for 15 minutes, 3 4 times a day. Limit salt in your diet.   Avoid heavy lifting, wear low heal shoes, and practice good posture.   Rest a lot with your legs elevated if you have leg cramps or low back pain.   Visit your dentist if you have not gone during your pregnancy. Use a soft toothbrush to brush your teeth and be gentle when you floss.   A sexual relationship may be continued unless your caregiver directs you otherwise.   Do not travel far distances unless it is absolutely necessary and only with the approval of your caregiver.   Take prenatal classes to understand, practice, and ask questions about the labor and delivery.   Make a trial run to the hospital.   Pack your hospital bag.   Prepare the baby's nursery.   Continue to go to all your prenatal visits as directed  by your caregiver.  SEEK MEDICAL CARE IF:   You are unsure if you are in labor or if your water has broken.   You have dizziness.   You have mild pelvic cramps, pelvic pressure, or nagging pain in your abdominal area.   You have persistent nausea, vomiting, or diarrhea.   You have a bad smelling vaginal discharge.   You have pain with urination.  SEEK IMMEDIATE MEDICAL CARE IF:    You have a fever.   You are leaking fluid from your vagina.   You have spotting or bleeding from your vagina.     You have severe abdominal cramping or pain.   You have rapid weight loss or gain.   You have shortness of breath with chest pain.   You notice sudden or extreme swelling of your face, hands, ankles, feet, or legs.   You have not felt your baby move in over an hour.   You have severe headaches that do not go away with medicine.   You have vision changes.  Document Released: 09/17/2001 Document Revised: 05/26/2013 Document Reviewed: 11/24/2012  [employer] Patient Information 2014 [employer].

## 2014-02-14 ENCOUNTER — Ambulatory Visit (HOSPITAL_COMMUNITY)
Admission: RE | Admit: 2014-02-14 | Discharge: 2014-02-14 | Disposition: A | Discharge status: Home / Self Care | Payer: Medicaid Other | Source: Ambulatory Visit | Attending: Family Medicine | Admitting: Family Medicine

## 2014-02-14 DIAGNOSIS — Z3689 Encounter for other specified antenatal screening: Secondary | ICD-10-CM | POA: Insufficient documentation

## 2014-02-14 DIAGNOSIS — Z3490 Encounter for supervision of normal pregnancy, unspecified, unspecified trimester: Secondary | ICD-10-CM

## 2014-02-16 ENCOUNTER — Encounter: Payer: Self-pay | Admitting: Family Medicine

## 2014-02-19 ENCOUNTER — Encounter: Payer: Self-pay | Admitting: *Deleted

## 2014-02-24 ENCOUNTER — Ambulatory Visit (INDEPENDENT_AMBULATORY_CARE_PROVIDER_SITE_OTHER): Payer: Medicaid Other | Admitting: Obstetrics & Gynecology

## 2014-02-24 VITALS — BP 121/75 | HR 99 | Temp 97.3°F | Wt 142.6 lb

## 2014-02-24 DIAGNOSIS — Z3480 Encounter for supervision of other normal pregnancy, unspecified trimester: Secondary | ICD-10-CM

## 2014-02-24 DIAGNOSIS — O09899 Supervision of other high risk pregnancies, unspecified trimester: Secondary | ICD-10-CM

## 2014-02-24 DIAGNOSIS — O09219 Supervision of pregnancy with history of pre-term labor, unspecified trimester: Secondary | ICD-10-CM

## 2014-02-24 DIAGNOSIS — Z3490 Encounter for supervision of normal pregnancy, unspecified, unspecified trimester: Secondary | ICD-10-CM

## 2014-02-24 LAB — POCT URINALYSIS DIP (DEVICE)
Bilirubin Urine: NEGATIVE
Glucose, UA: NEGATIVE mg/dL
HGB URINE DIPSTICK: NEGATIVE
Ketones, ur: NEGATIVE mg/dL
Leukocytes, UA: NEGATIVE
Nitrite: NEGATIVE
PROTEIN: NEGATIVE mg/dL
SPECIFIC GRAVITY, URINE: 1.02 (ref 1.005–1.030)
Urobilinogen, UA: 0.2 mg/dL (ref 0.0–1.0)
pH: 7 (ref 5.0–8.0)

## 2014-02-24 NOTE — Patient Instructions (Signed)
Return to clinic for any obstetric concerns or go to MAU for evaluation  

## 2014-02-24 NOTE — Progress Notes (Signed)
C/o feeling like pulling a lot in pelvis. C/o Saturday night had uc's q 3 minutes for about 30 minutes, but then stopped.

## 2014-02-24 NOTE — Progress Notes (Signed)
Patient reassured, fetal movement and labor precautions reviewed.

## 2014-03-01 ENCOUNTER — Telehealth: Payer: Self-pay | Admitting: *Deleted

## 2014-03-01 NOTE — Telephone Encounter (Signed)
Brittney Ashley called and left message wanting to know if it is normal for baby to have hiccups for most of day- states has not experienced that before. Called Brittney Ashley and she reports it isn't all day, but feels it for a few minutes than stops , is also feeling baby move normally.  We discussed is normal to feel hiccups- some babies have more that others. Discussed as long as no other problems, and is feeling baby move normally, no uc's , leaking, etc is usually normal. Instructed her may always come to MAU if baby not moving normally or other abnormal symptoms,

## 2014-03-10 ENCOUNTER — Encounter: Payer: Self-pay | Admitting: Family Medicine

## 2014-03-10 ENCOUNTER — Ambulatory Visit: Payer: Medicaid Other | Admitting: Family Medicine

## 2014-03-10 VITALS — BP 126/79 | HR 88 | Temp 97.1°F | Wt 146.3 lb

## 2014-03-10 DIAGNOSIS — R87619 Unspecified abnormal cytological findings in specimens from cervix uteri: Secondary | ICD-10-CM

## 2014-03-10 DIAGNOSIS — Z3490 Encounter for supervision of normal pregnancy, unspecified, unspecified trimester: Secondary | ICD-10-CM

## 2014-03-10 DIAGNOSIS — O09219 Supervision of pregnancy with history of pre-term labor, unspecified trimester: Secondary | ICD-10-CM

## 2014-03-10 DIAGNOSIS — O09899 Supervision of other high risk pregnancies, unspecified trimester: Secondary | ICD-10-CM

## 2014-03-10 LAB — POCT URINALYSIS DIP (DEVICE)
Bilirubin Urine: NEGATIVE
Glucose, UA: NEGATIVE mg/dL
Hgb urine dipstick: NEGATIVE
KETONES UR: NEGATIVE mg/dL
Nitrite: NEGATIVE
Protein, ur: NEGATIVE mg/dL
Specific Gravity, Urine: 1.02 (ref 1.005–1.030)
Urobilinogen, UA: 0.2 mg/dL (ref 0.0–1.0)
pH: 7 (ref 5.0–8.0)

## 2014-03-10 NOTE — Progress Notes (Signed)
Pt given the # to class education due to patient wanting tour of hospital and sibling class

## 2014-03-10 NOTE — Patient Instructions (Signed)
Third Trimester of Pregnancy The third trimester is from week 29 through week 42, months 7 through 9. The third trimester is a time when the fetus is growing rapidly. At the end of the ninth month, the fetus is about 20 inches in length and weighs 6 10 pounds.  BODY CHANGES Your body goes through many changes during pregnancy. The changes vary from woman to woman.   Your weight will continue to increase. You can expect to gain 25 35 pounds (11 16 kg) by the end of the pregnancy.  You may begin to get stretch marks on your hips, abdomen, and breasts.  You may urinate more often because the fetus is moving lower into your pelvis and pressing on your bladder.  You may develop or continue to have heartburn as a result of your pregnancy.  You may develop constipation because certain hormones are causing the muscles that push waste through your intestines to slow down.  You may develop hemorrhoids or swollen, bulging veins (varicose veins).  You may have pelvic pain because of the weight gain and pregnancy hormones relaxing your joints between the bones in your pelvis. Back aches may result from over exertion of the muscles supporting your posture.  Your breasts will continue to grow and be tender. A yellow discharge may leak from your breasts called colostrum.  Your belly button may stick out.  You may feel short of breath because of your expanding uterus.  You may notice the fetus "dropping," or moving lower in your abdomen.  You may have a bloody mucus discharge. This usually occurs a few days to a week before labor begins.  Your cervix becomes thin and soft (effaced) near your due date. WHAT TO EXPECT AT YOUR PRENATAL EXAMS  You will have prenatal exams every 2 weeks until week 36. Then, you will have weekly prenatal exams. During a routine prenatal visit:  You will be weighed to make sure you and the fetus are growing normally.  Your blood pressure is taken.  Your abdomen will  be measured to track your baby's growth.  The fetal heartbeat will be listened to.  Any test results from the previous visit will be discussed.  You may have a cervical check near your due date to see if you have effaced. At around 36 weeks, your caregiver will check your cervix. At the same time, your caregiver will also perform a test on the secretions of the vaginal tissue. This test is to determine if a type of bacteria, Group B streptococcus, is present. Your caregiver will explain this further. Your caregiver may ask you:  What your birth plan is.  How you are feeling.  If you are feeling the baby move.  If you have had any abnormal symptoms, such as leaking fluid, bleeding, severe headaches, or abdominal cramping.  If you have any questions. Other tests or screenings that may be performed during your third trimester include:  Blood tests that check for low iron levels (anemia).  Fetal testing to check the health, activity level, and growth of the fetus. Testing is done if you have certain medical conditions or if there are problems during the pregnancy. FALSE LABOR You may feel small, irregular contractions that eventually go away. These are called Braxton Hicks contractions, or false labor. Contractions may last for hours, days, or even weeks before true labor sets in. If contractions come at regular intervals, intensify, or become painful, it is best to be seen by your caregiver.    SIGNS OF LABOR   Menstrual-like cramps.  Contractions that are 5 minutes apart or less.  Contractions that start on the top of the uterus and spread down to the lower abdomen and back.  A sense of increased pelvic pressure or back pain.  A watery or bloody mucus discharge that comes from the vagina. If you have any of these signs before the 37th week of pregnancy, call your caregiver right away. You need to go to the hospital to get checked immediately. HOME CARE INSTRUCTIONS   Avoid all  smoking, herbs, alcohol, and unprescribed drugs. These chemicals affect the formation and growth of the baby.  Follow your caregiver's instructions regarding medicine use. There are medicines that are either safe or unsafe to take during pregnancy.  Exercise only as directed by your caregiver. Experiencing uterine cramps is a good sign to stop exercising.  Continue to eat regular, healthy meals.  Wear a good support bra for breast tenderness.  Do not use hot tubs, steam rooms, or saunas.  Wear your seat belt at all times when driving.  Avoid raw meat, uncooked cheese, cat litter boxes, and soil used by cats. These carry germs that can cause birth defects in the baby.  Take your prenatal vitamins.  Try taking a stool softener (if your caregiver approves) if you develop constipation. Eat more high-fiber foods, such as fresh vegetables or fruit and whole grains. Drink plenty of fluids to keep your urine clear or pale yellow.  Take warm sitz baths to soothe any pain or discomfort caused by hemorrhoids. Use hemorrhoid cream if your caregiver approves.  If you develop varicose veins, wear support hose. Elevate your feet for 15 minutes, 3 4 times a day. Limit salt in your diet.  Avoid heavy lifting, wear low heal shoes, and practice good posture.  Rest a lot with your legs elevated if you have leg cramps or low back pain.  Visit your dentist if you have not gone during your pregnancy. Use a soft toothbrush to brush your teeth and be gentle when you floss.  A sexual relationship may be continued unless your caregiver directs you otherwise.  Do not travel far distances unless it is absolutely necessary and only with the approval of your caregiver.  Take prenatal classes to understand, practice, and ask questions about the labor and delivery.  Make a trial run to the hospital.  Pack your hospital bag.  Prepare the baby's nursery.  Continue to go to all your prenatal visits as directed  by your caregiver. SEEK MEDICAL CARE IF:  You are unsure if you are in labor or if your water has broken.  You have dizziness.  You have mild pelvic cramps, pelvic pressure, or nagging pain in your abdominal area.  You have persistent nausea, vomiting, or diarrhea.  You have a bad smelling vaginal discharge.  You have pain with urination. SEEK IMMEDIATE MEDICAL CARE IF:   You have a fever.  You are leaking fluid from your vagina.  You have spotting or bleeding from your vagina.  You have severe abdominal cramping or pain.  You have rapid weight loss or gain.  You have shortness of breath with chest pain.  You notice sudden or extreme swelling of your face, hands, ankles, feet, or legs.  You have not felt your baby move in over an hour.  You have severe headaches that do not go away with medicine.  You have vision changes. Document Released: 09/17/2001 Document Revised: 05/26/2013 Document Reviewed:   11/24/2012 [employer] Patient Information 2014 [employer].  Breastfeeding Deciding to breastfeed is one of the best choices you can make for you and your baby. A change in hormones during pregnancy causes your breast tissue to grow and increases the number and size of your milk ducts. These hormones also allow proteins, sugars, and fats from your blood supply to make breast milk in your milk-producing glands. Hormones prevent breast milk from being released before your baby is born as well as prompt milk flow after birth. Once breastfeeding has begun, thoughts of your baby, as well as his or her sucking or crying, can stimulate the release of milk from your milk-producing glands.  BENEFITS OF BREASTFEEDING For Your Baby  Your first milk (colostrum) helps your baby's digestive system function better.   There are antibodies in your milk that help your baby fight off infections.   Your baby has a lower incidence of asthma, allergies, and sudden infant death syndrome.    The nutrients in breast milk are better for your baby than infant formulas and are designed uniquely for your baby's needs.   Breast milk improves your baby's brain development.   Your baby is less likely to develop other conditions, such as childhood obesity, asthma, or type 2 diabetes mellitus.  For You   Breastfeeding helps to create a very special bond between you and your baby.   Breastfeeding is convenient. Breast milk is always available at the correct temperature and costs nothing.   Breastfeeding helps to burn calories and helps you lose the weight gained during pregnancy.   Breastfeeding makes your uterus contract to its prepregnancy size faster and slows bleeding (lochia) after you give birth.   Breastfeeding helps to lower your risk of developing type 2 diabetes mellitus, osteoporosis, and breast or ovarian cancer later in life. SIGNS THAT YOUR BABY IS HUNGRY Early Signs of Hunger  Increased alertness or activity.  Stretching.  Movement of the head from side to side.  Movement of the head and opening of the mouth when the corner of the mouth or cheek is stroked (rooting).  Increased sucking sounds, smacking lips, cooing, sighing, or squeaking.  Hand-to-mouth movements.  Increased sucking of fingers or hands. Late Signs of Hunger  Fussing.  Intermittent crying. Extreme Signs of Hunger Signs of extreme hunger will require calming and consoling before your baby will be able to breastfeed successfully. Do not wait for the following signs of extreme hunger to occur before you initiate breastfeeding:   Restlessness.  A loud, strong cry.   Screaming. BREASTFEEDING BASICS Breastfeeding Initiation  Find a comfortable place to sit or lie down, with your neck and back well supported.  Place a pillow or rolled up blanket under your baby to bring him or her to the level of your breast (if you are seated). Nursing pillows are specially designed to help  support your arms and your baby while you breastfeed.  Make sure that your baby's abdomen is facing your abdomen.   Gently massage your breast. With your fingertips, massage from your chest wall toward your nipple in a circular motion. This encourages milk flow. You may need to continue this action during the feeding if your milk flows slowly.  Support your breast with 4 fingers underneath and your thumb above your nipple. Make sure your fingers are well away from your nipple and your baby's mouth.   Stroke your baby's lips gently with your finger or nipple.   When your baby's mouth is   open wide enough, quickly bring your baby to your breast, placing your entire nipple and as much of the colored area around your nipple (areola) as possible into your baby's mouth.   More areola should be visible above your baby's upper lip than below the lower lip.   Your baby's tongue should be between his or her lower gum and your breast.   Ensure that your baby's mouth is correctly positioned around your nipple (latched). Your baby's lips should create a seal on your breast and be turned out (everted).  It is common for your baby to suck about 2 3 minutes in order to start the flow of breast milk. Latching Teaching your baby how to latch on to your breast properly is very important. An improper latch can cause nipple pain and decreased milk supply for you and poor weight gain in your baby. Also, if your baby is not latched onto your nipple properly, he or she may swallow some air during feeding. This can make your baby fussy. Burping your baby when you switch breasts during the feeding can help to get rid of the air. However, teaching your baby to latch on properly is still the best way to prevent fussiness from swallowing air while breastfeeding. Signs that your baby has successfully latched on to your nipple:    Silent tugging or silent sucking, without causing you pain.   Swallowing heard  between every 3 4 sucks.    Muscle movement above and in front of his or her ears while sucking.  Signs that your baby has not successfully latched on to nipple:   Sucking sounds or smacking sounds from your baby while breastfeeding.  Nipple pain. If you think your baby has not latched on correctly, slip your finger into the corner of your baby's mouth to break the suction and place it between your baby's gums. Attempt breastfeeding initiation again. Signs of Successful Breastfeeding Signs from your baby:   A gradual decrease in the number of sucks or complete cessation of sucking.   Falling asleep.   Relaxation of his or her body.   Retention of a small amount of milk in his or her mouth.   Letting go of your breast by himself or herself. Signs from you:  Breasts that have increased in firmness, weight, and size 1 3 hours after feeding.   Breasts that are softer immediately after breastfeeding.  Increased milk volume, as well as a change in milk consistency and color by the 5th day of breastfeeding.   Nipples that are not sore, cracked, or bleeding. Signs That Your Baby is Getting Enough Milk  Wetting at least 3 diapers in a 24-hour period. The urine should be clear and pale yellow by age 5 days.  At least 3 stools in a 24-hour period by age 5 days. The stool should be soft and yellow.  At least 3 stools in a 24-hour period by age 7 days. The stool should be seedy and yellow.  No loss of weight greater than 10% of birth weight during the first 3 days of age.  Average weight gain of 4 7 ounces (120 210 mL) per week after age 4 days.  Consistent daily weight gain by age 5 days, without weight loss after the age of 2 weeks. After a feeding, your baby may spit up a small amount. This is common. BREASTFEEDING FREQUENCY AND DURATION Frequent feeding will help you make more milk and can prevent sore nipples and breast engorgement.   Breastfeed when you feel the need to  reduce the fullness of your breasts or when your baby shows signs of hunger. This is called "breastfeeding on demand." Avoid introducing a pacifier to your baby while you are working to establish breastfeeding (the first 4 6 weeks after your baby is born). After this time you may choose to use a pacifier. Research has shown that pacifier use during the first year of a baby's life decreases the risk of sudden infant death syndrome (SIDS). Allow your baby to feed on each breast as long as he or she wants. Breastfeed until your baby is finished feeding. When your baby unlatches or falls asleep while feeding from the first breast, offer the second breast. Because newborns are often sleepy in the first few weeks of life, you may need to awaken your baby to get him or her to feed. Breastfeeding times will vary from baby to baby. However, the following rules can serve as a guide to help you ensure that your baby is properly fed:  Newborns (babies 4 weeks of age or younger) may breastfeed every 1 3 hours.  Newborns should not go longer than 3 hours during the day or 5 hours during the night without breastfeeding.  You should breastfeed your baby a minimum of 8 times in a 24-hour period until you begin to introduce solid foods to your baby at around 6 months of age. BREAST MILK PUMPING Pumping and storing breast milk allows you to ensure that your baby is exclusively fed your breast milk, even at times when you are unable to breastfeed. This is especially important if you are going back to work while you are still breastfeeding or when you are not able to be present during feedings. Your lactation consultant can give you guidelines on how long it is safe to store breast milk.  A breast pump is a machine that allows you to pump milk from your breast into a sterile bottle. The pumped breast milk can then be stored in a refrigerator or freezer. Some breast pumps are operated by hand, while others use electricity. Ask  your lactation consultant which type will work best for you. Breast pumps can be purchased, but some hospitals and breastfeeding support groups lease breast pumps on a monthly basis. A lactation consultant can teach you how to hand express breast milk, if you prefer not to use a pump.  CARING FOR YOUR BREASTS WHILE YOU BREASTFEED Nipples can become dry, cracked, and sore while breastfeeding. The following recommendations can help keep your breasts moisturized and healthy:  Avoid using soap on your nipples.   Wear a supportive bra. Although not required, special nursing bras and tank tops are designed to allow access to your breasts for breastfeeding without taking off your entire bra or top. Avoid wearing underwire style bras or extremely tight bras.  Air dry your nipples for 3 4minutes after each feeding.   Use only cotton bra pads to absorb leaked breast milk. Leaking of breast milk between feedings is normal.   Use lanolin on your nipples after breastfeeding. Lanolin helps to maintain your skin's normal moisture barrier. If you use pure lanolin you do not need to wash it off before feeding your baby again. Pure lanolin is not toxic to your baby. You may also hand express a few drops of breast milk and gently massage that milk into your nipples and allow the milk to air dry. In the first few weeks after giving birth, some women   experience extremely full breasts (engorgement). Engorgement can make your breasts feel heavy, warm, and tender to the touch. Engorgement peaks within 3 5 days after you give birth. The following recommendations can help ease engorgement:  Completely empty your breasts while breastfeeding or pumping. You may want to start by applying warm, moist heat (in the shower or with warm water-soaked hand towels) just before feeding or pumping. This increases circulation and helps the milk flow. If your baby does not completely empty your breasts while breastfeeding, pump any extra  milk after he or she is finished.  Wear a snug bra (nursing or regular) or tank top for 1 2 days to signal your body to slightly decrease milk production.  Apply ice packs to your breasts, unless this is too uncomfortable for you.  Make sure that your baby is latched on and positioned properly while breastfeeding. If engorgement persists after 48 hours of following these recommendations, contact your health care provider or a lactation consultant. OVERALL HEALTH CARE RECOMMENDATIONS WHILE BREASTFEEDING  Eat healthy foods. Alternate between meals and snacks, eating 3 of each per day. Because what you eat affects your breast milk, some of the foods may make your baby more irritable than usual. Avoid eating these foods if you are sure that they are negatively affecting your baby.  Drink milk, fruit juice, and water to satisfy your thirst (about 10 glasses a day).   Rest often, relax, and continue to take your prenatal vitamins to prevent fatigue, stress, and anemia.  Continue breast self-awareness checks.  Avoid chewing and smoking tobacco.  Avoid alcohol and drug use. Some medicines that may be harmful to your baby can pass through breast milk. It is important to ask your health care provider before taking any medicine, including all over-the-counter and prescription medicine as well as vitamin and herbal supplements. It is possible to become pregnant while breastfeeding. If birth control is desired, ask your health care provider about options that will be safe for your baby. SEEK MEDICAL CARE IF:   You feel like you want to stop breastfeeding or have become frustrated with breastfeeding.  You have painful breasts or nipples.  Your nipples are cracked or bleeding.  Your breasts are red, tender, or warm.  You have a swollen area on either breast.  You have a fever or chills.  You have nausea or vomiting.  You have drainage other than breast milk from your nipples.  Your breasts  do not become full before feedings by the 5th day after you give birth.  You feel sad and depressed.  Your baby is too sleepy to eat well.  Your baby is having trouble sleeping.   Your baby is wetting less than 3 diapers in a 24-hour period.  Your baby has less than 3 stools in a 24-hour period.  Your baby's skin or the white part of his or her eyes becomes yellow.   Your baby is not gaining weight by 5 days of age. SEEK IMMEDIATE MEDICAL CARE IF:   Your baby is overly tired (lethargic) and does not want to wake up and feed.  Your baby develops an unexplained fever. Document Released: 09/23/2005 Document Revised: 05/26/2013 Document Reviewed: 03/17/2013 [employer] Patient Information 2014 [employer].  

## 2014-03-10 NOTE — Progress Notes (Signed)
Doing well--no complaints PTL precautions given

## 2014-03-23 ENCOUNTER — Encounter (HOSPITAL_COMMUNITY): Payer: Self-pay | Admitting: *Deleted

## 2014-03-23 ENCOUNTER — Inpatient Hospital Stay (HOSPITAL_COMMUNITY)
Admission: AD | Admit: 2014-03-23 | Discharge: 2014-03-23 | Disposition: A | Discharge status: Home / Self Care | Payer: Medicaid Other | Source: Ambulatory Visit | Attending: Obstetrics & Gynecology | Admitting: Obstetrics & Gynecology

## 2014-03-23 DIAGNOSIS — O99891 Other specified diseases and conditions complicating pregnancy: Secondary | ICD-10-CM | POA: Insufficient documentation

## 2014-03-23 DIAGNOSIS — O4700 False labor before 37 completed weeks of gestation, unspecified trimester: Secondary | ICD-10-CM | POA: Insufficient documentation

## 2014-03-23 DIAGNOSIS — O9989 Other specified diseases and conditions complicating pregnancy, childbirth and the puerperium: Secondary | ICD-10-CM

## 2014-03-23 DIAGNOSIS — R079 Chest pain, unspecified: Secondary | ICD-10-CM | POA: Insufficient documentation

## 2014-03-23 NOTE — MAU Provider Note (Signed)
  S: asked by nursing to see this pt for medical screen.   28 yo [id number] @ 3043w3d who presented via EMS for contractions and CP.   - hasn't slept in over 24 hours as [place].   - felt like she had gas in the middle of her chest and some acid in the back of her throat [facility] - [facility] heard this and called EMS for chest pain.  - denies any sob, diaphoresis, nausea, vomiting.  - on the way, started contracting more also.  Has been having contractions before but they are more regular now.  - in EMS, was able to relieve her cp with belching and now is asymptomatic other than ctx.   No lof, vb.  +FM.    States hasn't been drinking enough fluids and is very tired.   O: Filed Vitals:   03/23/14 1057  BP: 130/76  Pulse: 100  Temp: 98 F (36.7 C)  TempSrc: Oral  Resp: 18  Height: 5\' 2"  (1.575 m)  Weight: 66.225 kg (146 lb)  SpO2: 100%   Gen: NAD CV: RRR, no m/r/g PULM: LCTAB ABD: gravid, nontender, contractions palpate mild EXT: 2+ DP pulses  FHT: 120s, mod var, +accels, no decels.   TOCO: irregular ctx, phased out over the last 10 min.     A/P 28 yo [id number] @ 2843w3d who was brought via EMS for CP but is here now for ctx.   1) CP - on hx sounds like gas pain and was relieved by burping - reassuring - no further workup indicated currently  2) ctx - SVE 0.5/thick and high - no regular pattern on monitor - likely due to dehydration and lack of sleep  3) FWB - cat I tracing.   4) d/c to home with PTL precautions and f/u as scheduled.    BECK, Redmond BasemanKELI L, MD

## 2014-03-23 NOTE — MAU Note (Addendum)
Patient arrived to MAU via EMS for contractions that started today. Denies LOF or VB at this time. Reports good fetal movement. Patient states [facility] and started having chest pain; states they check bp and gave o2 and called EMS. Patient denies chest pain at this time.

## 2014-03-23 NOTE — Discharge Instructions (Signed)

## 2014-03-24 ENCOUNTER — Ambulatory Visit (INDEPENDENT_AMBULATORY_CARE_PROVIDER_SITE_OTHER): Payer: Medicaid Other | Admitting: Family Medicine

## 2014-03-24 VITALS — BP 124/84 | HR 88 | Temp 96.9°F | Wt 147.9 lb

## 2014-03-24 DIAGNOSIS — O09219 Supervision of pregnancy with history of pre-term labor, unspecified trimester: Secondary | ICD-10-CM

## 2014-03-24 DIAGNOSIS — O09899 Supervision of other high risk pregnancies, unspecified trimester: Secondary | ICD-10-CM

## 2014-03-24 LAB — POCT URINALYSIS DIP (DEVICE)
Bilirubin Urine: NEGATIVE
Glucose, UA: NEGATIVE mg/dL
KETONES UR: NEGATIVE mg/dL
Nitrite: NEGATIVE
Protein, ur: NEGATIVE mg/dL
Specific Gravity, Urine: 1.02 (ref 1.005–1.030)
UROBILINOGEN UA: 0.2 mg/dL (ref 0.0–1.0)
pH: 7 (ref 5.0–8.0)

## 2014-03-24 LAB — OB RESULTS CONSOLE GC/CHLAMYDIA
Chlamydia: NEGATIVE
Gonorrhea: NEGATIVE

## 2014-03-24 LAB — OB RESULTS CONSOLE GBS: GBS: NEGATIVE

## 2014-03-24 NOTE — Patient Instructions (Signed)
Third Trimester of Pregnancy The third trimester is from week 29 through week 42, months 7 through 9. The third trimester is a time when the fetus is growing rapidly. At the end of the ninth month, the fetus is about 20 inches in length and weighs 6-10 pounds.  BODY CHANGES Your body goes through many changes during pregnancy. The changes vary from woman to woman.   Your weight will continue to increase. You can expect to gain 25-35 pounds (11-16 kg) by the end of the pregnancy.  You may begin to get stretch marks on your hips, abdomen, and breasts.  You may urinate more often because the fetus is moving lower into your pelvis and pressing on your bladder.  You may develop or continue to have heartburn as a result of your pregnancy.  You may develop constipation because certain hormones are causing the muscles that push waste through your intestines to slow down.  You may develop hemorrhoids or swollen, bulging veins (varicose veins).  You may have pelvic pain because of the weight gain and pregnancy hormones relaxing your joints between the bones in your pelvis. Backaches may result from overexertion of the muscles supporting your posture.  You may have changes in your hair. These can include thickening of your hair, rapid growth, and changes in texture. Some women also have hair loss during or after pregnancy, or hair that feels dry or thin. Your hair will most likely return to normal after your baby is born.  Your breasts will continue to grow and be tender. A yellow discharge may leak from your breasts called colostrum.  Your belly button may stick out.  You may feel short of breath because of your expanding uterus.  You may notice the fetus "dropping," or moving lower in your abdomen.  You may have a bloody mucus discharge. This usually occurs a few days to a week before labor begins.  Your cervix becomes thin and soft (effaced) near your due date. WHAT TO EXPECT AT YOUR  PRENATAL EXAMS  You will have prenatal exams every 2 weeks until week 36. Then, you will have weekly prenatal exams. During a routine prenatal visit:  You will be weighed to make sure you and the fetus are growing normally.  Your blood pressure is taken.  Your abdomen will be measured to track your baby's growth.  The fetal heartbeat will be listened to.  Any test results from the previous visit will be discussed.  You may have a cervical check near your due date to see if you have effaced. At around 36 weeks, your caregiver will check your cervix. At the same time, your caregiver will also perform a test on the secretions of the vaginal tissue. This test is to determine if a type of bacteria, Group B streptococcus, is present. Your caregiver will explain this further. Your caregiver may ask you:  What your birth plan is.  How you are feeling.  If you are feeling the baby move.  If you have had any abnormal symptoms, such as leaking fluid, bleeding, severe headaches, or abdominal cramping.  If you have any questions. Other tests or screenings that may be performed during your third trimester include:  Blood tests that check for low iron levels (anemia).  Fetal testing to check the health, activity level, and growth of the fetus. Testing is done if you have certain medical conditions or if there are problems during the pregnancy. FALSE LABOR You may feel small, irregular contractions that   eventually go away. These are called Braxton Hicks contractions, or false labor. Contractions may last for hours, days, or even weeks before true labor sets in. If contractions come at regular intervals, intensify, or become painful, it is best to be seen by your caregiver.  SIGNS OF LABOR   Menstrual-like cramps.  Contractions that are 5 minutes apart or less.  Contractions that start on the top of the uterus and spread down to the lower abdomen and back.  A sense of increased pelvic  pressure or back pain.  A watery or bloody mucus discharge that comes from the vagina. If you have any of these signs before the 37th week of pregnancy, call your caregiver right away. You need to go to the hospital to get checked immediately. HOME CARE INSTRUCTIONS   Avoid all smoking, herbs, alcohol, and unprescribed drugs. These chemicals affect the formation and growth of the baby.  Follow your caregiver's instructions regarding medicine use. There are medicines that are either safe or unsafe to take during pregnancy.  Exercise only as directed by your caregiver. Experiencing uterine cramps is a good sign to stop exercising.  Continue to eat regular, healthy meals.  Wear a good support bra for breast tenderness.  Do not use hot tubs, steam rooms, or saunas.  Wear your seat belt at all times when driving.  Avoid raw meat, uncooked cheese, cat litter boxes, and soil used by cats. These carry germs that can cause birth defects in the baby.  Take your prenatal vitamins.  Try taking a stool softener (if your caregiver approves) if you develop constipation. Eat more high-fiber foods, such as fresh vegetables or fruit and whole grains. Drink plenty of fluids to keep your urine clear or pale yellow.  Take warm sitz baths to soothe any pain or discomfort caused by hemorrhoids. Use hemorrhoid cream if your caregiver approves.  If you develop varicose veins, wear support hose. Elevate your feet for 15 minutes, 3-4 times a day. Limit salt in your diet.  Avoid heavy lifting, wear low heal shoes, and practice good posture.  Rest a lot with your legs elevated if you have leg cramps or low back pain.  Visit your dentist if you have not gone during your pregnancy. Use a soft toothbrush to brush your teeth and be gentle when you floss.  A sexual relationship may be continued unless your caregiver directs you otherwise.  Do not travel far distances unless it is absolutely necessary and only  with the approval of your caregiver.  Take prenatal classes to understand, practice, and ask questions about the labor and delivery.  Make a trial run to the hospital.  Pack your hospital bag.  Prepare the baby's nursery.  Continue to go to all your prenatal visits as directed by your caregiver. SEEK MEDICAL CARE IF:  You are unsure if you are in labor or if your water has broken.  You have dizziness.  You have mild pelvic cramps, pelvic pressure, or nagging pain in your abdominal area.  You have persistent nausea, vomiting, or diarrhea.  You have a bad smelling vaginal discharge.  You have pain with urination. SEEK IMMEDIATE MEDICAL CARE IF:   You have a fever.  You are leaking fluid from your vagina.  You have spotting or bleeding from your vagina.  You have severe abdominal cramping or pain.  You have rapid weight loss or gain.  You have shortness of breath with chest pain.  You notice sudden or extreme swelling   of your face, hands, ankles, feet, or legs.  You have not felt your baby move in over an hour.  You have severe headaches that do not go away with medicine.  You have vision changes. Document Released: 09/17/2001 Document Revised: 09/28/2013 Document Reviewed: 11/24/2012 [employer] Patient Information 2015 [employer]. This information is not intended to replace advice given to you by your health care provider. Make sure you discuss any questions you have with your health care provider.  Breastfeeding Deciding to breastfeed is one of the best choices you can make for you and your baby. A change in hormones during pregnancy causes your breast tissue to grow and increases the number and size of your milk ducts. These hormones also allow proteins, sugars, and fats from your blood supply to make breast milk in your milk-producing glands. Hormones prevent breast milk from being released before your baby is born as well as prompt milk flow after birth. Once  breastfeeding has begun, thoughts of your baby, as well as his or her sucking or crying, can stimulate the release of milk from your milk-producing glands.  BENEFITS OF BREASTFEEDING For Your Baby  Your first milk (colostrum) helps your baby's digestive system function better.   There are antibodies in your milk that help your baby fight off infections.   Your baby has a lower incidence of asthma, allergies, and sudden infant death syndrome.   The nutrients in breast milk are better for your baby than infant formulas and are designed uniquely for your baby's needs.   Breast milk improves your baby's brain development.   Your baby is less likely to develop other conditions, such as childhood obesity, asthma, or type 2 diabetes mellitus.  For You   Breastfeeding helps to create a very special bond between you and your baby.   Breastfeeding is convenient. Breast milk is always available at the correct temperature and costs nothing.   Breastfeeding helps to burn calories and helps you lose the weight gained during pregnancy.   Breastfeeding makes your uterus contract to its prepregnancy size faster and slows bleeding (lochia) after you give birth.   Breastfeeding helps to lower your risk of developing type 2 diabetes mellitus, osteoporosis, and breast or ovarian cancer later in life. SIGNS THAT YOUR BABY IS HUNGRY Early Signs of Hunger  Increased alertness or activity.  Stretching.  Movement of the head from side to side.  Movement of the head and opening of the mouth when the corner of the mouth or cheek is stroked (rooting).  Increased sucking sounds, smacking lips, cooing, sighing, or squeaking.  Hand-to-mouth movements.  Increased sucking of fingers or hands. Late Signs of Hunger  Fussing.  Intermittent crying. Extreme Signs of Hunger Signs of extreme hunger will require calming and consoling before your baby will be able to breastfeed successfully. Do not  wait for the following signs of extreme hunger to occur before you initiate breastfeeding:   Restlessness.  A loud, strong cry.   Screaming. BREASTFEEDING BASICS Breastfeeding Initiation  Find a comfortable place to sit or lie down, with your neck and back well supported.  Place a pillow or rolled up blanket under your baby to bring him or her to the level of your breast (if you are seated). Nursing pillows are specially designed to help support your arms and your baby while you breastfeed.  Make sure that your baby's abdomen is facing your abdomen.   Gently massage your breast. With your fingertips, massage from your chest   wall toward your nipple in a circular motion. This encourages milk flow. You may need to continue this action during the feeding if your milk flows slowly.  Support your breast with 4 fingers underneath and your thumb above your nipple. Make sure your fingers are well away from your nipple and your baby's mouth.   Stroke your baby's lips gently with your finger or nipple.   When your baby's mouth is open wide enough, quickly bring your baby to your breast, placing your entire nipple and as much of the colored area around your nipple (areola) as possible into your baby's mouth.   More areola should be visible above your baby's upper lip than below the lower lip.   Your baby's tongue should be between his or her lower gum and your breast.   Ensure that your baby's mouth is correctly positioned around your nipple (latched). Your baby's lips should create a seal on your breast and be turned out (everted).  It is common for your baby to suck about 2-3 minutes in order to start the flow of breast milk. Latching Teaching your baby how to latch on to your breast properly is very important. An improper latch can cause nipple pain and decreased milk supply for you and poor weight gain in your baby. Also, if your baby is not latched onto your nipple properly, he or she  may swallow some air during feeding. This can make your baby fussy. Burping your baby when you switch breasts during the feeding can help to get rid of the air. However, teaching your baby to latch on properly is still the best way to prevent fussiness from swallowing air while breastfeeding. Signs that your baby has successfully latched on to your nipple:    Silent tugging or silent sucking, without causing you pain.   Swallowing heard between every 3-4 sucks.    Muscle movement above and in front of his or her ears while sucking.  Signs that your baby has not successfully latched on to nipple:   Sucking sounds or smacking sounds from your baby while breastfeeding.  Nipple pain. If you think your baby has not latched on correctly, slip your finger into the corner of your baby's mouth to break the suction and place it between your baby's gums. Attempt breastfeeding initiation again. Signs of Successful Breastfeeding Signs from your baby:   A gradual decrease in the number of sucks or complete cessation of sucking.   Falling asleep.   Relaxation of his or her body.   Retention of a small amount of milk in his or her mouth.   Letting go of your breast by himself or herself. Signs from you:  Breasts that have increased in firmness, weight, and size 1-3 hours after feeding.   Breasts that are softer immediately after breastfeeding.  Increased milk volume, as well as a change in milk consistency and color by the fifth day of breastfeeding.   Nipples that are not sore, cracked, or bleeding. Signs That Your Baby is Getting Enough Milk  Wetting at least 3 diapers in a 24-hour period. The urine should be clear and pale yellow by age 5 days.  At least 3 stools in a 24-hour period by age 5 days. The stool should be soft and yellow.  At least 3 stools in a 24-hour period by age 7 days. The stool should be seedy and yellow.  No loss of weight greater than 10% of birth weight  during the first 3   days of age.  Average weight gain of 4-7 ounces (113-198 g) per week after age 4 days.  Consistent daily weight gain by age 5 days, without weight loss after the age of 2 weeks. After a feeding, your baby may spit up a small amount. This is common. BREASTFEEDING FREQUENCY AND DURATION Frequent feeding will help you make more milk and can prevent sore nipples and breast engorgement. Breastfeed when you feel the need to reduce the fullness of your breasts or when your baby shows signs of hunger. This is called "breastfeeding on demand." Avoid introducing a pacifier to your baby while you are working to establish breastfeeding (the first 4-6 weeks after your baby is born). After this time you may choose to use a pacifier. Research has shown that pacifier use during the first year of a baby's life decreases the risk of sudden infant death syndrome (SIDS). Allow your baby to feed on each breast as long as he or she wants. Breastfeed until your baby is finished feeding. When your baby unlatches or falls asleep while feeding from the first breast, offer the second breast. Because newborns are often sleepy in the first few weeks of life, you may need to awaken your baby to get him or her to feed. Breastfeeding times will vary from baby to baby. However, the following rules can serve as a guide to help you ensure that your baby is properly fed:  Newborns (babies 4 weeks of age or younger) may breastfeed every 1-3 hours.  Newborns should not go longer than 3 hours during the day or 5 hours during the night without breastfeeding.  You should breastfeed your baby a minimum of 8 times in a 24-hour period until you begin to introduce solid foods to your baby at around 6 months of age. BREAST MILK PUMPING Pumping and storing breast milk allows you to ensure that your baby is exclusively fed your breast milk, even at times when you are unable to breastfeed. This is especially important if you are  going back to work while you are still breastfeeding or when you are not able to be present during feedings. Your lactation consultant can give you guidelines on how long it is safe to store breast milk.  A breast pump is a machine that allows you to pump milk from your breast into a sterile bottle. The pumped breast milk can then be stored in a refrigerator or freezer. Some breast pumps are operated by hand, while others use electricity. Ask your lactation consultant which type will work best for you. Breast pumps can be purchased, but some hospitals and breastfeeding support groups lease breast pumps on a monthly basis. A lactation consultant can teach you how to hand express breast milk, if you prefer not to use a pump.  CARING FOR YOUR BREASTS WHILE YOU BREASTFEED Nipples can become dry, cracked, and sore while breastfeeding. The following recommendations can help keep your breasts moisturized and healthy:  Avoid using soap on your nipples.   Wear a supportive bra. Although not required, special nursing bras and tank tops are designed to allow access to your breasts for breastfeeding without taking off your entire bra or top. Avoid wearing underwire-style bras or extremely tight bras.  Air dry your nipples for 3-4minutes after each feeding.   Use only cotton bra pads to absorb leaked breast milk. Leaking of breast milk between feedings is normal.   Use lanolin on your nipples after breastfeeding. Lanolin helps to maintain your skin's   normal moisture barrier. If you use pure lanolin, you do not need to wash it off before feeding your baby again. Pure lanolin is not toxic to your baby. You may also hand express a few drops of breast milk and gently massage that milk into your nipples and allow the milk to air dry. In the first few weeks after giving birth, some women experience extremely full breasts (engorgement). Engorgement can make your breasts feel heavy, warm, and tender to the touch.  Engorgement peaks within 3-5 days after you give birth. The following recommendations can help ease engorgement:  Completely empty your breasts while breastfeeding or pumping. You may want to start by applying warm, moist heat (in the shower or with warm water-soaked hand towels) just before feeding or pumping. This increases circulation and helps the milk flow. If your baby does not completely empty your breasts while breastfeeding, pump any extra milk after he or she is finished.  Wear a snug bra (nursing or regular) or tank top for 1-2 days to signal your body to slightly decrease milk production.  Apply ice packs to your breasts, unless this is too uncomfortable for you.  Make sure that your baby is latched on and positioned properly while breastfeeding. If engorgement persists after 48 hours of following these recommendations, contact your health care provider or a lactation consultant. OVERALL HEALTH CARE RECOMMENDATIONS WHILE BREASTFEEDING  Eat healthy foods. Alternate between meals and snacks, eating 3 of each per day. Because what you eat affects your breast milk, some of the foods may make your baby more irritable than usual. Avoid eating these foods if you are sure that they are negatively affecting your baby.  Drink milk, fruit juice, and water to satisfy your thirst (about 10 glasses a day).   Rest often, relax, and continue to take your prenatal vitamins to prevent fatigue, stress, and anemia.  Continue breast self-awareness checks.  Avoid chewing and smoking tobacco.  Avoid alcohol and drug use. Some medicines that may be harmful to your baby can pass through breast milk. It is important to ask your health care provider before taking any medicine, including all over-the-counter and prescription medicine as well as vitamin and herbal supplements. It is possible to become pregnant while breastfeeding. If birth control is desired, ask your health care provider about options that  will be safe for your baby. SEEK MEDICAL CARE IF:   You feel like you want to stop breastfeeding or have become frustrated with breastfeeding.  You have painful breasts or nipples.  Your nipples are cracked or bleeding.  Your breasts are red, tender, or warm.  You have a swollen area on either breast.  You have a fever or chills.  You have nausea or vomiting.  You have drainage other than breast milk from your nipples.  Your breasts do not become full before feedings by the fifth day after you give birth.  You feel sad and depressed.  Your baby is too sleepy to eat well.  Your baby is having trouble sleeping.   Your baby is wetting less than 3 diapers in a 24-hour period.  Your baby has less than 3 stools in a 24-hour period.  Your baby's skin or the white part of his or her eyes becomes yellow.   Your baby is not gaining weight by 5 days of age. SEEK IMMEDIATE MEDICAL CARE IF:   Your baby is overly tired (lethargic) and does not want to wake up and feed.  Your baby   develops an unexplained fever. Document Released: 09/23/2005 Document Revised: 09/28/2013 Document Reviewed: 03/17/2013 [employer] Patient Information 2015 [employer]. This information is not intended to replace advice given to you by your health care provider. Make sure you discuss any questions you have with your health care provider.  

## 2014-03-24 NOTE — Progress Notes (Signed)
Doing well Good FM Cultures today

## 2014-03-24 NOTE — Progress Notes (Signed)
Pain/pressure- pelvic

## 2014-03-26 LAB — GC/CHLAMYDIA PROBE AMP
CT PROBE, AMP APTIMA: NEGATIVE
GC Probe RNA: NEGATIVE

## 2014-03-27 LAB — CULTURE, BETA STREP (GROUP B ONLY)

## 2014-03-28 ENCOUNTER — Encounter: Payer: Self-pay | Admitting: Family Medicine

## 2014-03-28 ENCOUNTER — Encounter (HOSPITAL_COMMUNITY): Payer: Self-pay

## 2014-04-05 ENCOUNTER — Encounter: Payer: Self-pay | Admitting: Obstetrics and Gynecology

## 2014-04-05 ENCOUNTER — Ambulatory Visit (INDEPENDENT_AMBULATORY_CARE_PROVIDER_SITE_OTHER): Payer: Medicaid Other | Admitting: Obstetrics and Gynecology

## 2014-04-05 VITALS — BP 122/78 | HR 99 | Temp 97.3°F | Wt 147.7 lb

## 2014-04-05 DIAGNOSIS — J45909 Unspecified asthma, uncomplicated: Secondary | ICD-10-CM

## 2014-04-05 DIAGNOSIS — O09213 Supervision of pregnancy with history of pre-term labor, third trimester: Secondary | ICD-10-CM

## 2014-04-05 DIAGNOSIS — Z3493 Encounter for supervision of normal pregnancy, unspecified, third trimester: Secondary | ICD-10-CM

## 2014-04-05 DIAGNOSIS — J4520 Mild intermittent asthma, uncomplicated: Secondary | ICD-10-CM

## 2014-04-05 DIAGNOSIS — Z3480 Encounter for supervision of other normal pregnancy, unspecified trimester: Secondary | ICD-10-CM

## 2014-04-05 DIAGNOSIS — O09893 Supervision of other high risk pregnancies, third trimester: Secondary | ICD-10-CM

## 2014-04-05 DIAGNOSIS — O09219 Supervision of pregnancy with history of pre-term labor, unspecified trimester: Secondary | ICD-10-CM

## 2014-04-05 LAB — POCT URINALYSIS DIP (DEVICE)
BILIRUBIN URINE: NEGATIVE
Glucose, UA: NEGATIVE mg/dL
Ketones, ur: NEGATIVE mg/dL
NITRITE: NEGATIVE
PH: 7 (ref 5.0–8.0)
PROTEIN: 30 mg/dL — AB
Specific Gravity, Urine: 1.02 (ref 1.005–1.030)
UROBILINOGEN UA: 0.2 mg/dL (ref 0.0–1.0)

## 2014-04-05 NOTE — Progress Notes (Signed)
Pt reports feeling wet yesterday and is having itching at time in vaginal area.

## 2014-04-05 NOTE — Progress Notes (Signed)
Patient is doing well without complaints. FM/labor precautions reviewed. No abnormal discharge and no evidence of rupture on exam and by history

## 2014-04-06 ENCOUNTER — Encounter (HOSPITAL_COMMUNITY): Payer: Self-pay | Admitting: *Deleted

## 2014-04-06 ENCOUNTER — Inpatient Hospital Stay (HOSPITAL_COMMUNITY)
Admission: AD | Admit: 2014-04-06 | Discharge: 2014-04-06 | Disposition: A | Discharge status: Home / Self Care | Payer: Medicaid Other | Source: Ambulatory Visit | Attending: Family Medicine | Admitting: Family Medicine

## 2014-04-06 DIAGNOSIS — O479 False labor, unspecified: Secondary | ICD-10-CM | POA: Insufficient documentation

## 2014-04-06 NOTE — Progress Notes (Signed)
Pt states she is note hurting right now but she feels " a bunch of pressure"

## 2014-04-06 NOTE — MAU Note (Signed)
Pt states she lost her mucous plug about 1425, Pt states she is having contractions that are not that close together

## 2014-04-06 NOTE — MAU Note (Signed)
Urine in lab 

## 2014-04-06 NOTE — Discharge Instructions (Signed)
Third Trimester of Pregnancy °The third trimester is from week 29 through week 42, months 7 through 9. The third trimester is a time when the fetus is growing rapidly. At the end of the ninth month, the fetus is about 20 inches in length and weighs 6-10 pounds.  °BODY CHANGES °Your body goes through many changes during pregnancy. The changes vary from woman to woman.  °· Your weight will continue to increase. You can expect to gain 25-35 pounds (11-16 kg) by the end of the pregnancy. °· You may begin to get stretch marks on your hips, abdomen, and breasts. °· You may urinate more often because the fetus is moving lower into your pelvis and pressing on your bladder. °· You may develop or continue to have heartburn as a result of your pregnancy. °· You may develop constipation because certain hormones are causing the muscles that push waste through your intestines to slow down. °· You may develop hemorrhoids or swollen, bulging veins (varicose veins). °· You may have pelvic pain because of the weight gain and pregnancy hormones relaxing your joints between the bones in your pelvis. Backaches may result from overexertion of the muscles supporting your posture. °· You may have changes in your hair. These can include thickening of your hair, rapid growth, and changes in texture. Some women also have hair loss during or after pregnancy, or hair that feels dry or thin. Your hair will most likely return to normal after your baby is born. °· Your breasts will continue to grow and be tender. A yellow discharge may leak from your breasts called colostrum. °· Your belly button may stick out. °· You may feel short of breath because of your expanding uterus. °· You may notice the fetus "dropping," or moving lower in your abdomen. °· You may have a bloody mucus discharge. This usually occurs a few days to a week before labor begins. °· Your cervix becomes thin and soft (effaced) near your due date. °WHAT TO EXPECT AT YOUR PRENATAL  EXAMS  °You will have prenatal exams every 2 weeks until week 36. Then, you will have weekly prenatal exams. During a routine prenatal visit: °· You will be weighed to make sure you and the fetus are growing normally. °· Your blood pressure is taken. °· Your abdomen will be measured to track your baby's growth. °· The fetal heartbeat will be listened to. °· Any test results from the previous visit will be discussed. °· You may have a cervical check near your due date to see if you have effaced. °At around 36 weeks, your caregiver will check your cervix. At the same time, your caregiver will also perform a test on the secretions of the vaginal tissue. This test is to determine if a type of bacteria, Group B streptococcus, is present. Your caregiver will explain this further. °Your caregiver may ask you: °· What your birth plan is. °· How you are feeling. °· If you are feeling the baby move. °· If you have had any abnormal symptoms, such as leaking fluid, bleeding, severe headaches, or abdominal cramping. °· If you have any questions. °Other tests or screenings that may be performed during your third trimester include: °· Blood tests that check for low iron levels (anemia). °· Fetal testing to check the health, activity level, and growth of the fetus. Testing is done if you have certain medical conditions or if there are problems during the pregnancy. °FALSE LABOR °You may feel small, irregular contractions that   eventually go away. These are called Braxton Hicks contractions, or false labor. Contractions may last for hours, days, or even weeks before true labor sets in. If contractions come at regular intervals, intensify, or become painful, it is best to be seen by your caregiver.  °SIGNS OF LABOR  °· Menstrual-like cramps. °· Contractions that are 5 minutes apart or less. °· Contractions that start on the top of the uterus and spread down to the lower abdomen and back. °· A sense of increased pelvic pressure or back  pain. °· A watery or bloody mucus discharge that comes from the vagina. °If you have any of these signs before the 37th week of pregnancy, call your caregiver right away. You need to go to the hospital to get checked immediately. °HOME CARE INSTRUCTIONS  °· Avoid all smoking, herbs, alcohol, and unprescribed drugs. These chemicals affect the formation and growth of the baby. °· Follow your caregiver's instructions regarding medicine use. There are medicines that are either safe or unsafe to take during pregnancy. °· Exercise only as directed by your caregiver. Experiencing uterine cramps is a good sign to stop exercising. °· Continue to eat regular, healthy meals. °· Wear a good support bra for breast tenderness. °· Do not use hot tubs, steam rooms, or saunas. °· Wear your seat belt at all times when driving. °· Avoid raw meat, uncooked cheese, cat litter boxes, and soil used by cats. These carry germs that can cause birth defects in the baby. °· Take your prenatal vitamins. °· Try taking a stool softener (if your caregiver approves) if you develop constipation. Eat more high-fiber foods, such as fresh vegetables or fruit and whole grains. Drink plenty of fluids to keep your urine clear or pale yellow. °· Take warm sitz baths to soothe any pain or discomfort caused by hemorrhoids. Use hemorrhoid cream if your caregiver approves. °· If you develop varicose veins, wear support hose. Elevate your feet for 15 minutes, 3-4 times a day. Limit salt in your diet. °· Avoid heavy lifting, wear low heal shoes, and practice good posture. °· Rest a lot with your legs elevated if you have leg cramps or low back pain. °· Visit your dentist if you have not gone during your pregnancy. Use a soft toothbrush to brush your teeth and be gentle when you floss. °· A sexual relationship may be continued unless your caregiver directs you otherwise. °· Do not travel far distances unless it is absolutely necessary and only with the approval  of your caregiver. °· Take prenatal classes to understand, practice, and ask questions about the labor and delivery. °· Make a trial run to the hospital. °· Pack your hospital bag. °· Prepare the baby's nursery. °· Continue to go to all your prenatal visits as directed by your caregiver. °SEEK MEDICAL CARE IF: °· You are unsure if you are in labor or if your water has broken. °· You have dizziness. °· You have mild pelvic cramps, pelvic pressure, or nagging pain in your abdominal area. °· You have persistent nausea, vomiting, or diarrhea. °· You have a bad smelling vaginal discharge. °· You have pain with urination. °SEEK IMMEDIATE MEDICAL CARE IF:  °· You have a fever. °· You are leaking fluid from your vagina. °· You have spotting or bleeding from your vagina. °· You have severe abdominal cramping or pain. °· You have rapid weight loss or gain. °· You have shortness of breath with chest pain. °· You notice sudden or extreme swelling   of your face, hands, ankles, feet, or legs. °· You have not felt your baby move in over an hour. °· You have severe headaches that do not go away with medicine. °· You have vision changes. °Document Released: 09/17/2001 Document Revised: 09/28/2013 Document Reviewed: 11/24/2012 °[employer]® Patient Information ©2015 [employer]. This information is not intended to replace advice given to you by your health care provider. Make sure you discuss any questions you have with your health care provider. °Fetal Movement Counts °Patient Name: __________________________________________________ Patient Due Date: ____________________ °Performing a fetal movement count is highly recommended in high-risk pregnancies, but it is good for every pregnant woman to do. Your caregiver may ask you to start counting fetal movements at 28 weeks of the pregnancy. Fetal movements often increase: °· After eating a full meal. °· After physical activity. °· After eating or drinking something sweet or cold. °· At  rest. °Pay attention to when you feel the baby is most active. This will help you notice a pattern of your baby's sleep and wake cycles and what factors contribute to an increase in fetal movement. It is important to perform a fetal movement count at the same time each day when your baby is normally most active.  °HOW TO COUNT FETAL MOVEMENTS °1. Find a quiet and comfortable area to sit or lie down on your left side. Lying on your left side provides the best blood and oxygen circulation to your baby. °2. Write down the day and time on a sheet of paper or in a journal. °3. Start counting kicks, flutters, swishes, rolls, or jabs in a 2 hour period. You should feel at least 10 movements within 2 hours. °4. If you do not feel 10 movements in 2 hours, wait 2-3 hours and count again. Look for a change in the pattern or not enough counts in 2 hours. °SEEK MEDICAL CARE IF: °· You feel less than 10 counts in 2 hours, tried twice. °· There is no movement in over an hour. °· The pattern is changing or taking longer each day to reach 10 counts in 2 hours. °· You feel the baby is not moving as he or she usually does. °Date: ____________ Movements: ____________ Start time: ____________ Finish time: ____________  °Date: ____________ Movements: ____________ Start time: ____________ Finish time: ____________ °Date: ____________ Movements: ____________ Start time: ____________ Finish time: ____________ °Date: ____________ Movements: ____________ Start time: ____________ Finish time: ____________ °Date: ____________ Movements: ____________ Start time: ____________ Finish time: ____________ °Date: ____________ Movements: ____________ Start time: ____________ Finish time: ____________ °Date: ____________ Movements: ____________ Start time: ____________ Finish time: ____________ °Date: ____________ Movements: ____________ Start time: ____________ Finish time: ____________  °Date: ____________ Movements: ____________ Start time:  ____________ Finish time: ____________ °Date: ____________ Movements: ____________ Start time: ____________ Finish time: ____________ °Date: ____________ Movements: ____________ Start time: ____________ Finish time: ____________ °Date: ____________ Movements: ____________ Start time: ____________ Finish time: ____________ °Date: ____________ Movements: ____________ Start time: ____________ Finish time: ____________ °Date: ____________ Movements: ____________ Start time: ____________ Finish time: ____________ °Date: ____________ Movements: ____________ Start time: ____________ Finish time: ____________  °Date: ____________ Movements: ____________ Start time: ____________ Finish time: ____________ °Date: ____________ Movements: ____________ Start time: ____________ Finish time: ____________ °Date: ____________ Movements: ____________ Start time: ____________ Finish time: ____________ °Date: ____________ Movements: ____________ Start time: ____________ Finish time: ____________ °Date: ____________ Movements: ____________ Start time: ____________ Finish time: ____________ °Date: ____________ Movements: ____________ Start time: ____________ Finish time: ____________ °Date: ____________ Movements: ____________ Start time: ____________ Finish time: ____________  °  Date: ____________ Movements: ____________ Start time: ____________ Finish time: ____________ °Date: ____________ Movements: ____________ Start time: ____________ Finish time: ____________ °Date: ____________ Movements: ____________ Start time: ____________ Finish time: ____________ °Date: ____________ Movements: ____________ Start time: ____________ Finish time: ____________ °Date: ____________ Movements: ____________ Start time: ____________ Finish time: ____________ °Date: ____________ Movements: ____________ Start time: ____________ Finish time: ____________ °Date: ____________ Movements: ____________ Start time: ____________ Finish time: ____________  °Date:  ____________ Movements: ____________ Start time: ____________ Finish time: ____________ °Date: ____________ Movements: ____________ Start time: ____________ Finish time: ____________ °Date: ____________ Movements: ____________ Start time: ____________ Finish time: ____________ °Date: ____________ Movements: ____________ Start time: ____________ Finish time: ____________ °Date: ____________ Movements: ____________ Start time: ____________ Finish time: ____________ °Date: ____________ Movements: ____________ Start time: ____________ Finish time: ____________ °Date: ____________ Movements: ____________ Start time: ____________ Finish time: ____________  °Date: ____________ Movements: ____________ Start time: ____________ Finish time: ____________ °Date: ____________ Movements: ____________ Start time: ____________ Finish time: ____________ °Date: ____________ Movements: ____________ Start time: ____________ Finish time: ____________ °Date: ____________ Movements: ____________ Start time: ____________ Finish time: ____________ °Date: ____________ Movements: ____________ Start time: ____________ Finish time: ____________ °Date: ____________ Movements: ____________ Start time: ____________ Finish time: ____________ °Date: ____________ Movements: ____________ Start time: ____________ Finish time: ____________  °Date: ____________ Movements: ____________ Start time: ____________ Finish time: ____________ °Date: ____________ Movements: ____________ Start time: ____________ Finish time: ____________ °Date: ____________ Movements: ____________ Start time: ____________ Finish time: ____________ °Date: ____________ Movements: ____________ Start time: ____________ Finish time: ____________ °Date: ____________ Movements: ____________ Start time: ____________ Finish time: ____________ °Date: ____________ Movements: ____________ Start time: ____________ Finish time: ____________ °Date: ____________ Movements: ____________ Start  time: ____________ Finish time: ____________  °Date: ____________ Movements: ____________ Start time: ____________ Finish time: ____________ °Date: ____________ Movements: ____________ Start time: ____________ Finish time: ____________ °Date: ____________ Movements: ____________ Start time: ____________ Finish time: ____________ °Date: ____________ Movements: ____________ Start time: ____________ Finish time: ____________ °Date: ____________ Movements: ____________ Start time: ____________ Finish time: ____________ °Date: ____________ Movements: ____________ Start time: ____________ Finish time: ____________ °Document Released: 10/23/2006 Document Revised: 09/09/2012 Document Reviewed: 07/20/2012 °[employer]® Patient Information ©2015 [employer]. This information is not intended to replace advice given to you by your health care provider. Make sure you discuss any questions you have with your health care provider. ° °

## 2014-04-10 ENCOUNTER — Inpatient Hospital Stay (HOSPITAL_COMMUNITY)
Admission: AD | Admit: 2014-04-10 | Discharge: 2014-04-12 | DRG: 775 | Disposition: A | Discharge status: Home / Self Care | Payer: Medicaid Other | Source: Ambulatory Visit | Attending: Obstetrics and Gynecology | Admitting: Obstetrics and Gynecology

## 2014-04-10 ENCOUNTER — Inpatient Hospital Stay (HOSPITAL_COMMUNITY): Payer: Medicaid Other | Admitting: Anesthesiology

## 2014-04-10 ENCOUNTER — Encounter (HOSPITAL_COMMUNITY): Payer: Self-pay | Admitting: *Deleted

## 2014-04-10 ENCOUNTER — Encounter (HOSPITAL_COMMUNITY): Payer: Medicaid Other | Admitting: Anesthesiology

## 2014-04-10 DIAGNOSIS — J45909 Unspecified asthma, uncomplicated: Secondary | ICD-10-CM | POA: Diagnosis present

## 2014-04-10 DIAGNOSIS — IMO0001 Reserved for inherently not codable concepts without codable children: Secondary | ICD-10-CM

## 2014-04-10 DIAGNOSIS — Z8249 Family history of ischemic heart disease and other diseases of the circulatory system: Secondary | ICD-10-CM | POA: Diagnosis not present

## 2014-04-10 DIAGNOSIS — Z833 Family history of diabetes mellitus: Secondary | ICD-10-CM | POA: Diagnosis not present

## 2014-04-10 DIAGNOSIS — Z3490 Encounter for supervision of normal pregnancy, unspecified, unspecified trimester: Secondary | ICD-10-CM

## 2014-04-10 DIAGNOSIS — O479 False labor, unspecified: Secondary | ICD-10-CM | POA: Diagnosis present

## 2014-04-10 LAB — URINALYSIS, ROUTINE W REFLEX MICROSCOPIC
BILIRUBIN URINE: NEGATIVE
GLUCOSE, UA: NEGATIVE mg/dL
Hgb urine dipstick: NEGATIVE
Ketones, ur: NEGATIVE mg/dL
Nitrite: NEGATIVE
PH: 6 (ref 5.0–8.0)
Protein, ur: NEGATIVE mg/dL
Specific Gravity, Urine: 1.015 (ref 1.005–1.030)
Urobilinogen, UA: 0.2 mg/dL (ref 0.0–1.0)

## 2014-04-10 LAB — CBC
HCT: 36.8 % (ref 36.0–46.0)
Hemoglobin: 13 g/dL (ref 12.0–15.0)
MCH: 34.1 pg — ABNORMAL HIGH (ref 26.0–34.0)
MCHC: 35.3 g/dL (ref 30.0–36.0)
MCV: 96.6 fL (ref 78.0–100.0)
Platelets: 245 K/uL (ref 150–400)
RBC: 3.81 MIL/uL — ABNORMAL LOW (ref 3.87–5.11)
RDW: 13.8 % (ref 11.5–15.5)
WBC: 15.1 K/uL — ABNORMAL HIGH (ref 4.0–10.5)

## 2014-04-10 LAB — URINE MICROSCOPIC-ADD ON

## 2014-04-10 LAB — RPR

## 2014-04-10 MED ORDER — MEASLES, MUMPS & RUBELLA VAC ~~LOC~~ INJ
0.5000 mL | INJECTION | Freq: Once | SUBCUTANEOUS | Status: DC
  Filled 2014-04-10: qty 0.5

## 2014-04-10 MED ORDER — ONDANSETRON HCL 4 MG PO TABS: 4.0000 mg | ORAL_TABLET | ORAL | Status: DC | PRN

## 2014-04-10 MED ORDER — OXYTOCIN BOLUS FROM INFUSION: 500.0000 mL | INTRAVENOUS | Status: DC

## 2014-04-10 MED ORDER — WITCH HAZEL-GLYCERIN EX PADS: 1.0000 "application " | MEDICATED_PAD | CUTANEOUS | Status: DC | PRN

## 2014-04-10 MED ORDER — SENNOSIDES-DOCUSATE SODIUM 8.6-50 MG PO TABS
2.0000 | ORAL_TABLET | ORAL | Status: DC
  Administered 2014-04-10 – 2014-04-11 (×2): 2 via ORAL
  Filled 2014-04-10 (×2): qty 2
  Filled 2014-04-10: qty 1

## 2014-04-10 MED ORDER — LIDOCAINE HCL (PF) 1 % IJ SOLN
30.0000 mL | INTRAMUSCULAR | Status: AC | PRN
  Administered 2014-04-10 (×2): 5 mL via SUBCUTANEOUS

## 2014-04-10 MED ORDER — ONDANSETRON HCL 4 MG/2ML IJ SOLN: 4.0000 mg | INTRAMUSCULAR | Status: DC | PRN

## 2014-04-10 MED ORDER — FENTANYL 2.5 MCG/ML BUPIVACAINE 1/10 % EPIDURAL INFUSION (WH - ANES)
14.0000 mL/h | INTRAMUSCULAR | Status: DC | PRN
  Administered 2014-04-10 (×2): 14 mL/h via EPIDURAL
  Filled 2014-04-10 (×2): qty 125

## 2014-04-10 MED ORDER — LACTATED RINGERS IV SOLN
INTRAVENOUS | Status: DC
  Administered 2014-04-10 (×2): via INTRAVENOUS

## 2014-04-10 MED ORDER — SIMETHICONE 80 MG PO CHEW: 80.0000 mg | CHEWABLE_TABLET | ORAL | Status: DC | PRN

## 2014-04-10 MED ORDER — LACTATED RINGERS IV SOLN: 500.0000 mL | INTRAVENOUS | Status: DC | PRN

## 2014-04-10 MED ORDER — DIPHENHYDRAMINE HCL 50 MG/ML IJ SOLN: 12.5000 mg | INTRAMUSCULAR | Status: DC | PRN

## 2014-04-10 MED ORDER — OXYCODONE-ACETAMINOPHEN 5-325 MG PO TABS: 1.0000 | ORAL_TABLET | ORAL | Status: DC | PRN

## 2014-04-10 MED ORDER — DIBUCAINE 1 % RE OINT: 1.0000 "application " | TOPICAL_OINTMENT | RECTAL | Status: DC | PRN

## 2014-04-10 MED ORDER — PRENATAL MULTIVITAMIN CH
1.0000 | ORAL_TABLET | Freq: Every day | ORAL | Status: DC
  Administered 2014-04-11 – 2014-04-12 (×2): 1 via ORAL
  Filled 2014-04-10 (×2): qty 1

## 2014-04-10 MED ORDER — TETANUS-DIPHTH-ACELL PERTUSSIS 5-2.5-18.5 LF-MCG/0.5 IM SUSP: 0.5000 mL | Freq: Once | INTRAMUSCULAR | Status: DC

## 2014-04-10 MED ORDER — CITRIC ACID-SODIUM CITRATE 334-500 MG/5ML PO SOLN: 30.0000 mL | ORAL | Status: DC | PRN

## 2014-04-10 MED ORDER — OXYTOCIN 40 UNITS IN LACTATED RINGERS INFUSION - SIMPLE MED
62.5000 mL/h | INTRAVENOUS | Status: DC
  Administered 2014-04-10: 62.5 mL/h via INTRAVENOUS
  Filled 2014-04-10: qty 1000

## 2014-04-10 MED ORDER — DIPHENHYDRAMINE HCL 25 MG PO CAPS: 25.0000 mg | ORAL_CAPSULE | Freq: Four times a day (QID) | ORAL | Status: DC | PRN

## 2014-04-10 MED ORDER — EPHEDRINE 5 MG/ML INJ
10.0000 mg | INTRAVENOUS | Status: DC | PRN
  Filled 2014-04-10: qty 2

## 2014-04-10 MED ORDER — ALBUTEROL SULFATE (2.5 MG/3ML) 0.083% IN NEBU: 2.5000 mg | INHALATION_SOLUTION | Freq: Four times a day (QID) | RESPIRATORY_TRACT | Status: DC | PRN

## 2014-04-10 MED ORDER — ACETAMINOPHEN 325 MG PO TABS: 650.0000 mg | ORAL_TABLET | ORAL | Status: DC | PRN

## 2014-04-10 MED ORDER — IBUPROFEN 600 MG PO TABS
600.0000 mg | ORAL_TABLET | Freq: Four times a day (QID) | ORAL | Status: DC
  Administered 2014-04-10 – 2014-04-12 (×6): 600 mg via ORAL
  Filled 2014-04-10 (×7): qty 1

## 2014-04-10 MED ORDER — PHENYLEPHRINE 40 MCG/ML (10ML) SYRINGE FOR IV PUSH (FOR BLOOD PRESSURE SUPPORT)
80.0000 ug | PREFILLED_SYRINGE | INTRAVENOUS | Status: DC | PRN
  Filled 2014-04-10: qty 2
  Filled 2014-04-10: qty 10

## 2014-04-10 MED ORDER — FLEET ENEMA 7-19 GM/118ML RE ENEM: 1.0000 | ENEMA | RECTAL | Status: DC | PRN

## 2014-04-10 MED ORDER — LACTATED RINGERS IV SOLN
500.0000 mL | Freq: Once | INTRAVENOUS | Status: AC
  Administered 2014-04-10: 500 mL via INTRAVENOUS

## 2014-04-10 MED ORDER — BENZOCAINE-MENTHOL 20-0.5 % EX AERO: 1.0000 "application " | INHALATION_SPRAY | CUTANEOUS | Status: DC | PRN

## 2014-04-10 MED ORDER — PHENYLEPHRINE 40 MCG/ML (10ML) SYRINGE FOR IV PUSH (FOR BLOOD PRESSURE SUPPORT)
80.0000 ug | PREFILLED_SYRINGE | INTRAVENOUS | Status: DC | PRN
  Filled 2014-04-10: qty 2

## 2014-04-10 MED ORDER — ONDANSETRON HCL 4 MG/2ML IJ SOLN: 4.0000 mg | Freq: Four times a day (QID) | INTRAMUSCULAR | Status: DC | PRN

## 2014-04-10 MED ORDER — LANOLIN HYDROUS EX OINT: TOPICAL_OINTMENT | CUTANEOUS | Status: DC | PRN

## 2014-04-10 MED ORDER — IBUPROFEN 600 MG PO TABS: 600.0000 mg | ORAL_TABLET | Freq: Four times a day (QID) | ORAL | Status: DC | PRN

## 2014-04-10 MED ORDER — ZOLPIDEM TARTRATE 5 MG PO TABS: 5.0000 mg | ORAL_TABLET | Freq: Every evening | ORAL | Status: DC | PRN

## 2014-04-10 NOTE — MAU Note (Signed)
Pt presents to MAU with complaints of contractions that started around 615 this morning. Denies any vaginal bleeding or LOF

## 2014-04-10 NOTE — Progress Notes (Signed)
Dr. Reola CalkinsBeck notified of pt's cervical exam, ctx's spacing out, will reevaluate in another hour.

## 2014-04-10 NOTE — Progress Notes (Signed)
Brittney Ashley is a 28 y.o. [id number] at 6271w0d admitted for active labor  Subjective: Doing well. Comfortable with epidural.  +Fm.   Objective: BP 108/61  Pulse 84  Temp(Src) 98.1 F (36.7 C) (Axillary)  Resp 18  Ht 5\' 2"  (1.575 m)  Wt 66.679 kg (147 lb)  BMI 26.88 kg/m2  SpO2 100%  LMP 07/11/2013   Total I/O In: -  Out: 200 [Urine:200]  FHT:  FHR: 130 bpm, variability: moderate,  accelerations:  Present,  decelerations:  Absent UC:   regular, every 2-4 minutes SVE:   Dilation: 6.5 Effacement (%): 90 Station: 0 Exam by:: [email or web address]  Labs: Lab Results  Component Value Date   WBC 15.1* 04/10/2014   HGB 13.0 04/10/2014   HCT 36.8 04/10/2014   MCV 96.6 04/10/2014   PLT 245 04/10/2014    Assessment / Plan: Spontaneous labor, progressing normally  Labor: Progressing normally Fetal Wellbeing:  Category I Pain Control:  Epidural I/D:  n/a Anticipated MOD:  NSVD  Brittney Ashley 04/10/2014, 3:58 PM

## 2014-04-10 NOTE — H&P (Signed)
Attestation of Attending Supervision of Advanced Practitioner (CNM/NP): Evaluation and management procedures were performed by the Advanced Practitioner under my supervision and collaboration.  I have reviewed the Advanced Practitioner's note and chart, and I agree with the management and plan.  Kathya Wilz 04/10/2014 5:53 PM   

## 2014-04-10 NOTE — Anesthesia Procedure Notes (Signed)
Epidural Patient location during procedure: OB Start time: 04/10/2014 12:54 PM End time: 04/10/2014 1:11 PM  Staffing Anesthesiologist: Yusuke Beza, CHRIS Performed by: anesthesiologist   Preanesthetic Checklist Completed: patient identified, surgical consent, pre-op evaluation, timeout performed, IV checked, risks and benefits discussed and monitors and equipment checked  Epidural Patient position: sitting Prep: site prepped and draped and DuraPrep Patient monitoring: heart rate, cardiac monitor, continuous pulse ox and blood pressure Approach: midline Location: L3-L4 Injection technique: LOR saline  Needle:  Needle type: Tuohy  Needle gauge: 17 G Needle length: 9 cm Needle insertion depth: 5 cm Catheter type: closed end flexible Catheter size: 19 Gauge Catheter at skin depth: 11 cm Test dose: Other  Assessment Events: blood not aspirated, injection not painful, no injection resistance, negative IV test and no paresthesia  Additional Notes H+P and labs checked, risks and benefits discussed with the patient, consent obtained, procedure tolerated well and without complications.  Reason for block:procedure for pain

## 2014-04-10 NOTE — Anesthesia Preprocedure Evaluation (Signed)
Anesthesia Evaluation  Patient identified by MRN, date of birth, ID band Patient awake    Reviewed: Allergy & Precautions, H&P , NPO status , Patient's Chart, lab work & pertinent test results  History of Anesthesia Complications Negative for: history of anesthetic complications  Airway Mallampati: II TM Distance: >3 FB     Dental   Pulmonary asthma ,          Cardiovascular negative cardio ROS  Rhythm:Regular     Neuro/Psych negative neurological ROS     GI/Hepatic negative GI ROS, Neg liver ROS,   Endo/Other    Renal/GU negative Renal ROS     Musculoskeletal   Abdominal   Peds  Hematology   Anesthesia Other Findings   Reproductive/Obstetrics (+) Pregnancy                           Anesthesia Physical Anesthesia Plan  ASA: II  Anesthesia Plan: Epidural   Post-op Pain Management:    Induction:   Airway Management Planned:   Additional Equipment:   Intra-op Plan:   Post-operative Plan:   Informed Consent: I have reviewed the patients History and Physical, chart, labs and discussed the procedure including the risks, benefits and alternatives for the proposed anesthesia with the patient or authorized representative who has indicated his/her understanding and acceptance.   Dental advisory given  Plan Discussed with: Anesthesiologist  Anesthesia Plan Comments:         Anesthesia Quick Evaluation

## 2014-04-10 NOTE — H&P (Signed)
LABOR ADMISSION HISTORY AND PHYSICAL  Brittney Ashley is a 28 y.o. female [phone] with IUP at 4839w0d by LMP c/w 8wk US presenting for contractions.   States has been having ctx since last night but got to be regular and stronger around 6am this morning. Has had some bloody show but no frank VB.  No LOF.  +FM.    [facility]. Has a hx of preterm delivery but then term delivery after and declined 17-ohp this pregnancy. Has otherwise been uncomplicated.    Prenatal History/Complications:  Past Medical History: Past Medical History  Diagnosis Date  . PPD positive, treated 2008    Past Surgical History: Past Surgical History  Procedure Laterality Date  . Foot surgery      Obstetrical History: OB History   Grav Para Term Preterm Abortions TAB SAB Ect Mult Living   3 2 1 1  0 0 0 0 0 2     G1- 5lb2oz, NSVD G20 7lb 7oz, NSVD  Social History: History   Social History  . Marital Status: Single    Spouse Name: N/A    Number of Children: N/A  . Years of Education: N/A   Social History Main Topics  . Smoking status: Never Smoker   . Smokeless tobacco: Never Used  . Alcohol Use: No  . Drug Use: No  . Sexual Activity: Yes   Other Topics Concern  . None   Social History Narrative  . None    Family History: Family History  Problem Relation Age of Onset  . Hypertension Mother   . Diabetes Maternal Grandmother     Allergies: No Known Allergies  Prescriptions prior to admission  Medication Sig Dispense Refill  . pantoprazole (PROTONIX) 40 MG tablet Take 1 tablet (40 mg total) by mouth daily.  30 tablet  3  . ranitidine (ZANTAC) 150 MG tablet Take 150 mg by mouth 2 (two) times daily as needed for heartburn.       Marland Kitchen. albuterol (PROVENTIL HFA;VENTOLIN HFA) 108 (90 BASE) MCG/ACT inhaler Inhale 2 puffs into the lungs every 6 (six) hours as needed for wheezing or shortness of breath.  1 Inhaler  2  . Prenatal Vit-Fe Fumarate-FA (MULTIVITAMIN-PRENATAL) 27-0.8  MG TABS tablet Take 1 tablet by mouth daily at 12 noon.  60 each  3     Review of Systems   All systems reviewed and negative except as stated in HPI  Blood pressure 127/74, pulse 80, temperature 98 F (36.7 C), resp. rate 18, last menstrual period 07/11/2013. General appearance: alert, cooperative and mild distress Lungs: clear to auscultation bilaterally Heart: regular rate and rhythm Abdomen: soft, non-tender; bowel sounds normal Extremities: Homans sign is negative, no sign of DVT DTR's 2+ Presentation: vtx by nurse check Fetal monitoringBaseline: 120 bpm, Variability: Good {> 6 bpm), Accelerations: Reactive and Decelerations: Early Uterine activity irregular every 4-306min   Dilation: 4 Effacement (%): 90 Station: -2 Exam by:: SBeck, RN   Prenatal labs: ABO, Rh: O/Positive/-- (12/10 0000) Antibody: Negative (12/10 0000) Rubella:   RPR: NON REAC (04/23 1002)  HBsAg: Negative (12/10 0000)  HIV: NONREACTIVE (04/23 1002)  GBS: Negative (06/18 0000)  1 hr Glucola 80 Genetic screening  normal Anatomy US normal   Prenatal Transfer Tool  Maternal Diabetes: No Genetic Screening: Normal Maternal Ultrasounds/Referrals: Normal Fetal Ultrasounds or other Referrals:  None Maternal Substance Abuse:  No Significant Maternal Medications:  None Significant Maternal Lab Results: None     Results for  orders placed during the hospital encounter of 04/10/14 (from the past 24 hour(s))  URINALYSIS, ROUTINE W REFLEX MICROSCOPIC   Collection Time    04/10/14  7:40 AM      Result Value Ref Range   Color, Urine YELLOW  YELLOW   APPearance CLEAR  CLEAR   Specific Gravity, Urine 1.015  1.005 - 1.030   pH 6.0  5.0 - 8.0   Glucose, UA NEGATIVE  NEGATIVE mg/dL   Hgb urine dipstick NEGATIVE  NEGATIVE   Bilirubin Urine NEGATIVE  NEGATIVE   Ketones, ur NEGATIVE  NEGATIVE mg/dL   Protein, ur NEGATIVE  NEGATIVE mg/dL   Urobilinogen, UA 0.2  0.0 - 1.0 mg/dL   Nitrite NEGATIVE  NEGATIVE    Leukocytes, UA MODERATE (*) NEGATIVE  URINE MICROSCOPIC-ADD ON   Collection Time    04/10/14  7:40 AM      Result Value Ref Range   Squamous Epithelial / LPF FEW (*) RARE   WBC, UA 7-10  <3 WBC/hpf   RBC / HPF 0-2  <3 RBC/hpf   Bacteria, UA FEW (*) RARE    Assessment: Brittney Ashley is a 28 y.o. [id number] at 78110w0d here for early labor.     #Labor: admit to [facility]. Expectant management  #Pain: Epidural prn #FWB: Cat I tracing. EFW 8lbs on leopolds #ID:  GBS neg #MOF: breast #MOC: mirena #Circ:  N/a- it's a girl. Brittney Ashley!  Brittney Ashley 04/10/2014, 11:28 AM

## 2014-04-11 MED ORDER — PNEUMOCOCCAL VAC POLYVALENT 25 MCG/0.5ML IJ INJ
0.5000 mL | INJECTION | INTRAMUSCULAR | Status: DC
  Filled 2014-04-11: qty 0.5

## 2014-04-11 MED ORDER — ACETAMINOPHEN 325 MG PO TABS
650.0000 mg | ORAL_TABLET | Freq: Four times a day (QID) | ORAL | Status: DC | PRN
  Administered 2014-04-11: 650 mg via ORAL
  Filled 2014-04-11: qty 2

## 2014-04-11 NOTE — Progress Notes (Signed)
Post Partum Day 1  Subjective: no complaints, up ad lib, voiding, tolerating PO and no BM or flatus  Objective: Blood pressure 105/69, pulse 81, temperature 97.6 F (36.4 C), temperature source Oral, resp. rate 18, height 5\' 2"  (1.575 m), weight 66.679 kg (147 lb), last menstrual period 07/11/2013, SpO2 96.00%, unknown if currently breastfeeding.  Physical Exam:  General: alert and cooperative Lochia: appropriate Uterine Fundus: firm Incision: N/A DVT Evaluation: No evidence of DVT seen on physical exam. Negative Homan's sign. No cords or calf tenderness. No significant calf/ankle edema.   Recent Labs  04/10/14 1215  HGB 13.0  HCT 36.8    Assessment/Plan: Plan for discharge tomorrow, Breastfeeding and Contraception Mirena   LOS: 1 day   Letta PateSanders, Tamiko Leopard 04/11/2014, 7:29 AM

## 2014-04-11 NOTE — Lactation Note (Signed)
This note was copied from the chart of Brittney Ashley. Lactation Consultation Note  Mother called and requested breast pump.  She was told by MD that baby has a tight frenulum and may not be getting sufficient milk supply so she wanted to start pumping. Mother denies any soreness or problems with bf with the exception of fussiness and cluster feeding last night. Infant has limited tongue movement.  Infant is unable to protude tongue past bottom gum line.  Set up DEBP.  Reviewed use, cleaning and milk storage. Recommend mother post pump for 15 min and give baby back what is pumped 4-6 times a day. Reinforced finger syringe feeding.  Left Lactation phone number and encouraged mother to call to view next feeding. Mother has DEBP medela breast pump at home.   Patient Name: Brittney Ashley RUEAV'WToday's Date: 04/11/2014 Reason for consult: Follow-up assessment   Maternal Data    Feeding    LATCH Score/Interventions                      Lactation Tools Discussed/Used     Consult Status Consult Status: Follow-up Date: 04/12/14 Follow-up type: In-patient    Brittney Ashley, [facility] 04/11/2014, 9:22 AM

## 2014-04-11 NOTE — Lactation Note (Signed)
This note was copied from the chart of Brittney Ashley. Lactation Consultation Note Baby has limited movement to tongue. Acting very hungry, fussy. Mom has good colostrum. I don't feel that the baby is getting a complete transfer of colostrum. Hand pump and hand massage demonstrated and colostrum obtained to give to baby d/t mom exhausted. Baby has uncoordinated suck w/no lip seal at intervals. Mom stated the MD said the baby was "tongue tied" and may need clipped if wasn't BF well. Discussed w/mom options of BF positions.  Patient Name: Brittney Ashley OZHYQ'MToday's Date: 04/11/2014 Reason for consult: Initial assessment   Maternal Data    Feeding Feeding Type: Breast Fed Length of feed: 20 min  LATCH Score/Interventions Latch: Grasps breast easily, tongue down, lips flanged, rhythmical sucking.  Audible Swallowing: A few with stimulation Intervention(s): Skin to skin;Hand expression;Alternate breast massage  Type of Nipple: Everted at rest and after stimulation  Comfort (Breast/Nipple): Soft / non-tender     Hold (Positioning): Assistance needed to correctly position infant at breast and maintain latch. Intervention(s): Breastfeeding basics reviewed;Support Pillows;Position options;Skin to skin  LATCH Score: 8  Lactation Tools Discussed/Used Tools: Pump Breast pump type: Manual Initiated by:: Peri JeffersonL. Starlene Consuegra RN Date initiated:: 04/11/14   Consult Status Consult Status: Follow-up Date: 04/11/14 Follow-up type: In-patient    Charyl DancerCARVER, Elva Breaker G 04/11/2014, 3:43 AM

## 2014-04-11 NOTE — Lactation Note (Signed)
This note was copied from the chart of Brittney Ashley. Lactation Consultation Note  Mother called to view latch.  Mother placed baby in fb hold. Baby latches easily, sucks and swallows observed.  Reviewed massaging her breasts to keep baby active at the breast. Mother plans to post pump after feeding and give volume pumped to baby at next feeding. Pumping volume will help to insure baby is getting adequate amount of bm. Mother denies soreness.  Labial frenulum observed at tip of tongue. Provided mother with volume guidelines.  Mother has DEBP at home. Recommend she make an outpatient appt to evaluate feeding after discharge.  Baby cueing after feeding.  Encouarged mother to post pump and give baby back volume.    Patient Name: Brittney Ashley ZOXWR'UToday's Date: 04/11/2014 Reason for consult: Follow-up assessment   Maternal Data    Feeding Feeding Type: Breast Fed  LATCH Score/Interventions Latch: Grasps breast easily, tongue down, lips flanged, rhythmical sucking.  Audible Swallowing: A few with stimulation Intervention(s): Alternate breast massage;Skin to skin  Type of Nipple: Everted at rest and after stimulation  Comfort (Breast/Nipple): Soft / non-tender     Hold (Positioning): Assistance needed to correctly position infant at breast and maintain latch. Intervention(s): Position options  LATCH Score: 8  Lactation Tools Discussed/Used     Consult Status Consult Status: Follow-up Date: 04/12/14 Follow-up type: In-patient    Dahlia ByesBerkelhammer, [facility] Ashley [facility] 04/11/2014, 11:36 AM

## 2014-04-11 NOTE — Progress Notes (Signed)
Ur chart review completed.  

## 2014-04-11 NOTE — Anesthesia Postprocedure Evaluation (Signed)
Anesthesia Post Note  Patient: Brittney Ashley  Procedure(s) Performed: * No procedures listed *  Anesthesia type: Epidural  Patient location: Mother/Baby  Post pain: Pain level controlled  Post assessment: Post-op Vital signs reviewed  Last Vitals:  Filed Vitals:   04/11/14 0645  BP: 105/69  Pulse: 81  Temp: 36.4 C  Resp: 18    Post vital signs: Reviewed  Level of consciousness:alert  Complications: No apparent anesthesia complications

## 2014-04-12 ENCOUNTER — Encounter: Payer: Self-pay | Admitting: Obstetrics & Gynecology

## 2014-04-12 MED ORDER — IBUPROFEN 600 MG PO TABS: 600.0000 mg | ORAL_TABLET | Freq: Four times a day (QID) | ORAL | Status: DC

## 2014-04-12 NOTE — Discharge Summary (Signed)
Obstetric Discharge Summary Reason for Admission: onset of labor Prenatal Procedures: none Intrapartum Procedures: spontaneous vaginal delivery Postpartum Procedures: none Complications-Operative and Postpartum: none Hemoglobin  Date Value Ref Range Status  04/10/2014 13.0  12.0 - 15.0 g/dL Final  40/98/119112/07/2013 47.813.6   Final     HCT  Date Value Ref Range Status  04/10/2014 36.8  36.0 - 46.0 % Final  09/15/2013 40   Final    Physical Exam:  General: alert and cooperative Lochia: appropriate Uterine Fundus: firm Incision: N/A DVT Evaluation: No evidence of DVT seen on physical exam. Negative Homan's sign. No cords or calf tenderness. No significant calf/ankle edema.  Discharge Diagnoses: Term Pregnancy-delivered  Discharge Information: Date: 04/12/2014 Activity: pelvic rest Diet: routine Medications: PNV and Ibuprofen Condition: stable Instructions: refer to practice specific booklet Discharge to: home   Newborn Data: Live born female  Birth Weight: 7 lb 13.8 oz (3566 g) APGAR: 9, 10  Home with mother.  Letta PateSanders, Alyssa 04/12/2014, 9:04 AM  I have seen and examined this patient and agree with above documentation in the resident's note. Pt presented in active labor and progressed to deliver a liveborn female via NSVD. Her postpartum care was uncomplicated. She is breast feeding and desires mirena for contraception.   Rulon AbideKeli Jerral Mccauley, M.D. [facility] 04/12/2014 10:50 AM
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
## 2014-04-12 NOTE — Discharge Summary (Signed)
Attestation of Attending Supervision of Fellow: Evaluation and management procedures were performed by the Fellow under my supervision and collaboration.  I have reviewed the Fellow's note and chart, and I agree with the management and plan.    

## 2014-04-12 NOTE — Discharge Instructions (Signed)
Vaginal Delivery °Care After °Refer to this sheet in the next few weeks. These discharge instructions provide you with information on caring for yourself after delivery. Your caregiver may also give you specific instructions. Your treatment has been planned according to the most current medical practices available, but problems sometimes occur. Call your caregiver if you have any problems or questions after you go home. °HOME CARE INSTRUCTIONS °· Take over-the-counter or prescription medicines only as directed by your caregiver or pharmacist. °· Do not drink alcohol, especially if you are breastfeeding or taking medicine to relieve pain. °· Do not chew or smoke tobacco. °· Do not use illegal drugs. °· Continue to use good perineal care. Good perineal care includes: °¨ Wiping your perineum from front to back. °¨ Keeping your perineum clean. °· Do not use tampons or douche until your caregiver says it is okay. °· Shower, wash your hair, and take tub baths as directed by your caregiver. °· Wear a well-fitting bra that provides breast support. °· Eat healthy foods. °· Drink enough fluids to keep your urine clear or pale yellow. °· Eat high-fiber foods such as whole grain cereals and breads, brown rice, beans, and fresh fruits and vegetables every day. These foods may help prevent or relieve constipation. °· Follow your cargiver's recommendations regarding resumption of activities such as climbing stairs, driving, lifting, exercising, or traveling. °· Talk to your caregiver about resuming sexual activities. Resumption of sexual activities is dependent upon your risk of infection, your rate of healing, and your comfort and desire to resume sexual activity. °· Try to have someone help you with your household activities and your newborn for at least a few days after you leave the hospital. °· Rest as much as possible. Try to rest or take a nap when your newborn is sleeping. °· Increase your activities gradually. °· Keep all  of your scheduled postpartum appointments. It is very important to keep your scheduled follow-up appointments. At these appointments, your caregiver will be checking to make sure that you are healing physically and emotionally. °SEEK MEDICAL CARE IF:  °· You are passing large clots from your vagina. Save any clots to show your caregiver. °· You have a foul smelling discharge from your vagina. °· You have trouble urinating. °· You are urinating frequently. °· You have pain when you urinate. °· You have a change in your bowel movements. °· You have increasing redness, pain, or swelling near your vaginal incision (episiotomy) or vaginal tear. °· You have pus draining from your episiotomy or vaginal tear. °· Your episiotomy or vaginal tear is separating. °· You have painful, hard, or reddened breasts. °· You have a severe headache. °· You have blurred vision or see spots. °· You feel sad or depressed. °· You have thoughts of hurting yourself or your newborn. °· You have questions about your care, the care of your newborn, or medicines. °· You are dizzy or lightheaded. °· You have a rash. °· You have nausea or vomiting. °· You were breastfeeding and have not had a menstrual period within 12 weeks after you stopped breastfeeding. °· You are not breastfeeding and have not had a menstrual period by the 12th week after delivery. °· You have a fever. °SEEK IMMEDIATE MEDICAL CARE IF:  °· You have persistent pain. °· You have chest pain. °· You have shortness of breath. °· You faint. °· You have leg pain. °· You have stomach pain. °· Your vaginal bleeding saturates two or more sanitary pads   in 1 hour. °MAKE SURE YOU:  °· Understand these instructions. °· Will watch your condition. °· Will get help right away if you are not doing well or get worse. ° ° °Document Released: 09/20/2000 Document Revised: 06/17/2012 Document Reviewed: 05/20/2012 °Exit[employer]® Patient Information ©2015 [employer]. This information is not intended to  replace advice given to you by your health care provider. Make sure you discuss any questions you have with your health care provider. ° °

## 2014-04-13 ENCOUNTER — Encounter: Payer: Medicaid Other | Admitting: Family Medicine

## 2014-04-13 NOTE — Progress Notes (Signed)
I have seen and examined this patient and I agree with the above. Cam HaiSHAW, KIMBERLY CNM 9:24 AM 04/13/2014

## 2014-04-28 ENCOUNTER — Ambulatory Visit (HOSPITAL_COMMUNITY): Payer: Medicaid Other

## 2014-05-04 ENCOUNTER — Ambulatory Visit (HOSPITAL_COMMUNITY)
Admission: RE | Admit: 2014-05-04 | Discharge: 2014-05-04 | Disposition: A | Discharge status: Home / Self Care | Payer: Medicaid Other | Source: Ambulatory Visit | Attending: Obstetrics & Gynecology | Admitting: Obstetrics & Gynecology
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
## 2014-05-16 ENCOUNTER — Encounter: Payer: Self-pay | Admitting: Advanced Practice Midwife

## 2014-05-16 ENCOUNTER — Ambulatory Visit (INDEPENDENT_AMBULATORY_CARE_PROVIDER_SITE_OTHER): Payer: Medicaid Other | Admitting: Advanced Practice Midwife

## 2014-05-16 VITALS — BP 114/64 | HR 60 | Temp 98.3°F | Wt 130.3 lb

## 2014-05-16 DIAGNOSIS — R87619 Unspecified abnormal cytological findings in specimens from cervix uteri: Secondary | ICD-10-CM

## 2014-05-16 MED ORDER — DOCUSATE SODIUM 100 MG PO CAPS: 100.0000 mg | ORAL_CAPSULE | Freq: Two times a day (BID) | ORAL | Status: DC | PRN

## 2014-05-16 NOTE — Progress Notes (Signed)
Here for postpartum visit.Wants to get IUD for birth control.  States has had unprotected intercourse last week once and last night.

## 2014-05-16 NOTE — Progress Notes (Signed)
  Subjective:     Brittney Ashley is a 28 y.o. female who presents for a postpartum visit. She is 4 weeks postpartum following a spontaneous vaginal delivery. I have fully reviewed the prenatal and intrapartum course. The delivery was at 39 gestational weeks. Outcome: spontaneous vaginal delivery. Anesthesia: epidural. Postpartum course has been normal. Baby's course has been normal except problems with breastfeeding latch and possible need for minor oral surgery.  Baby is feeding by formula only since yesterday r/t latch problems. Breastfeeding and formula until yesterday.. Bleeding no bleeding. Bowel function is abnormal: constipation. Bladder function is normal. Patient is sexually active. Contraception method is none. Postpartum depression screening: negative.  The following portions of the patient's history were reviewed and updated as appropriate: allergies, current medications, past family history, past medical history, past social history, past surgical history and problem list.  Review of Systems A comprehensive review of systems was negative.   Objective:    BP 114/64  Pulse 60  Temp(Src) 98.3 F (36.8 C)  Wt 130 lb 4.8 oz (59.104 kg)  Breastfeeding? No  General:  alert and no distress       Physical Examination: General appearance - alert, well appearing, and in no distress, normal appearing weight and acyanotic, in no respiratory distress Assessment:     Normal postpartum exam.   Plan:    1. Contraception: Unprotected intercourse 1 week ago. Reschedule Mirena IUD placement for 1 week from now. No unprotected intercourse until next visit.  2. Colace 100 mg BID PRN for constipation.  Discussed dietary changes including increased fiber and fluids. 3.  Discussed breastfeeding with pt.  Overall has been good experience but painful with infant poor latch.  She is undecided if she will continue to pump or latch baby until she has her frenulum oral surgery.  Discussed how  breastfeeding may improve afterwards so she may want to continue to pump/latch for a couple of weeks, especially if infant's surgery can be scheduled soon.  Pt states understanding.  3. Follow up in: 2 weeks for IUD placement or as needed.

## 2014-05-25 ENCOUNTER — Encounter: Payer: Self-pay | Admitting: Advanced Practice Midwife

## 2014-05-25 ENCOUNTER — Ambulatory Visit (INDEPENDENT_AMBULATORY_CARE_PROVIDER_SITE_OTHER): Payer: Medicaid Other | Admitting: Advanced Practice Midwife

## 2014-05-25 VITALS — BP 120/67 | HR 67 | Temp 98.1°F | Ht 62.0 in | Wt 133.5 lb

## 2014-05-25 DIAGNOSIS — Z30430 Encounter for insertion of intrauterine contraceptive device: Secondary | ICD-10-CM

## 2014-05-25 LAB — POCT PREGNANCY, URINE: Preg Test, Ur: NEGATIVE

## 2014-05-25 MED ORDER — LEVONORGESTREL 20 MCG/24HR IU IUD
INTRAUTERINE_SYSTEM | Freq: Once | INTRAUTERINE | Status: AC
  Administered 2014-05-25: 1 via INTRAUTERINE

## 2014-05-27 ENCOUNTER — Encounter: Payer: Self-pay | Admitting: General Practice

## 2014-05-27 NOTE — Progress Notes (Signed)
Patient ID: Brittney Ashley, female   DOB: 09/11/1986, 28 y.o.   MRN: [id number]  IUD Procedure Note Patient identified, informed consent performed.  Discussed risks of irregular bleeding, cramping, infection, malpositioning or misplacement of the IUD outside the uterus which may require further procedures. Time out was performed.  Urine pregnancy test negative.  Pt was seen 2 weeks ago with negative pregnancy test but unprotected intercourse 1 week prior to that visit.  No unprotected intercourse x 2 weeks and negative test today prior to insertion.  Speculum placed in the vagina.  Cervix visualized.  Cleaned with Betadine x 2.  Grasped anteriorly with a single tooth tenaculum.  Uterus sounded to 7.5 cm.  Mirena IUD placed per manufacturer's recommendations.  Strings trimmed to 3 cm. Tenaculum was removed, good hemostasis noted.  Patient tolerated procedure well.   Patient was given post-procedure instructions and the Mirena care card with expiration date.  Patient was also asked to check IUD strings periodically and follow up in 4-6 weeks for IUD check.

## 2014-06-29 ENCOUNTER — Other Ambulatory Visit (HOSPITAL_COMMUNITY)
Admission: RE | Admit: 2014-06-29 | Discharge: 2014-06-29 | Disposition: A | Discharge status: Home / Self Care | Payer: Medicaid Other | Source: Ambulatory Visit | Attending: Obstetrics and Gynecology | Admitting: Obstetrics and Gynecology

## 2014-06-29 ENCOUNTER — Encounter: Payer: Self-pay | Admitting: Obstetrics and Gynecology

## 2014-06-29 ENCOUNTER — Ambulatory Visit (INDEPENDENT_AMBULATORY_CARE_PROVIDER_SITE_OTHER): Payer: Medicaid Other | Admitting: Obstetrics and Gynecology

## 2014-06-29 VITALS — BP 108/66 | HR 77 | Temp 98.5°F | Ht 62.0 in | Wt 131.1 lb

## 2014-06-29 DIAGNOSIS — Z01419 Encounter for gynecological examination (general) (routine) without abnormal findings: Secondary | ICD-10-CM | POA: Insufficient documentation

## 2014-06-29 NOTE — Progress Notes (Signed)
Patient ID: Brittney Ashley, female   DOB: 17-May-1986, 28 y.o.   MRN: [id number] 28 yo G3P2103 s/p IUD insertion on 05/27/2014 presenting today for IUD check and annual exam. Patient is doing well without complaints. Reports light spotting s/p IUD insertion. Patient is otherwise doing well and is without complaints. Patient is interested in full STD screen  Past Medical History  Diagnosis Date  . PPD positive, treated 2008   Past Surgical History  Procedure Laterality Date  . Foot surgery     Family History  Problem Relation Age of Onset  . Hypertension Mother   . Diabetes Maternal Grandmother    History  Substance Use Topics  . Smoking status: Never Smoker   . Smokeless tobacco: Never Used  . Alcohol Use: No   GENERAL: Well-developed, well-nourished female in no acute distress.  LUNGS: Clear to auscultation bilaterally.  HEART: Regular rate and rhythm. BREASTS: Symmetric in size. No palpable masses or lymphadenopathy, skin changes, or nipple drainage. ABDOMEN: Soft, nontender, nondistended. No organomegaly. PELVIC: Normal external female genitalia. Vagina is pink and rugated.  Normal discharge. Normal appearing cervix. IUD strings visualized. Uterus is normal in size. No adnexal mass or tenderness. EXTREMITIES: No cyanosis, clubbing, or edema, 2+ distal pulses.  A/P 28 yo G3P2103 here for string check and annual exam - Normal annual exam - IUD appears to be in proper location - Pap smear and cultures collected - Hep B, C, RPR and HIV collected - Patient will be contacted with any abnormal results

## 2014-06-30 LAB — HEPATITIS C ANTIBODY: HCV Ab: NEGATIVE

## 2014-06-30 LAB — HIV ANTIBODY (ROUTINE TESTING W REFLEX): HIV: NONREACTIVE

## 2014-06-30 LAB — HEPATITIS B SURFACE ANTIGEN: Hepatitis B Surface Ag: NEGATIVE

## 2014-06-30 LAB — RPR

## 2014-07-01 LAB — CYTOLOGY - PAP

## 2014-08-08 ENCOUNTER — Encounter: Payer: Self-pay | Admitting: Obstetrics and Gynecology

## 2014-09-19 ENCOUNTER — Encounter: Payer: Self-pay | Admitting: *Deleted

## 2014-09-20 ENCOUNTER — Encounter (HOSPITAL_COMMUNITY): Payer: Self-pay | Admitting: *Deleted

## 2014-09-20 ENCOUNTER — Emergency Department (HOSPITAL_COMMUNITY)
Admission: EM | Admit: 2014-09-20 | Discharge: 2014-09-20 | Disposition: A | Discharge status: Home / Self Care | Payer: Medicaid Other | Attending: Emergency Medicine | Admitting: Emergency Medicine

## 2014-09-20 DIAGNOSIS — Z3202 Encounter for pregnancy test, result negative: Secondary | ICD-10-CM | POA: Insufficient documentation

## 2014-09-20 DIAGNOSIS — R109 Unspecified abdominal pain: Secondary | ICD-10-CM

## 2014-09-20 DIAGNOSIS — N898 Other specified noninflammatory disorders of vagina: Secondary | ICD-10-CM | POA: Diagnosis present

## 2014-09-20 DIAGNOSIS — R1084 Generalized abdominal pain: Secondary | ICD-10-CM | POA: Insufficient documentation

## 2014-09-20 DIAGNOSIS — Z79899 Other long term (current) drug therapy: Secondary | ICD-10-CM | POA: Diagnosis not present

## 2014-09-20 LAB — WET PREP, GENITAL
TRICH WET PREP: NONE SEEN
Yeast Wet Prep HPF POC: NONE SEEN

## 2014-09-20 LAB — URINALYSIS, ROUTINE W REFLEX MICROSCOPIC
BILIRUBIN URINE: NEGATIVE
Glucose, UA: NEGATIVE mg/dL
Hgb urine dipstick: NEGATIVE
KETONES UR: NEGATIVE mg/dL
Leukocytes, UA: NEGATIVE
NITRITE: NEGATIVE
PH: 6 (ref 5.0–8.0)
PROTEIN: NEGATIVE mg/dL
Specific Gravity, Urine: 1.026 (ref 1.005–1.030)
Urobilinogen, UA: 0.2 mg/dL (ref 0.0–1.0)

## 2014-09-20 LAB — POC URINE PREG, ED: Preg Test, Ur: NEGATIVE

## 2014-09-20 NOTE — ED Notes (Signed)
PA Browning at bedside. 

## 2014-09-20 NOTE — ED Notes (Signed)
Patient reports she had IUD placed in Sept.  She has developed abd pain and back pain, vaginal discharge, dark in color after sex.  Patient states she also has noted odor. Patient denies rash.  [facility]  She was unable to get in to see them

## 2014-09-20 NOTE — Discharge Instructions (Signed)

## 2014-09-20 NOTE — ED Provider Notes (Signed)
CSN: [id number]     Arrival date & time 09/20/14  1139 History   First MD Initiated Contact with Patient 09/20/14 1150     Chief Complaint  Patient presents with  . Vaginal Discharge  . Abdominal Pain     (Consider location/radiation/quality/duration/timing/severity/associated sxs/prior Treatment) HPI Comments: Patient presents emergency department with chief complaint of abdominal cramping and vaginal discharge. She states she recently had an [facility]. She states that following this, she is noticing increased abdominal cramping and some discharge that is dark in color. It is especially noted after sex. She also complains of foul odor. Patient denies any dysuria. She has not taken anything to alleviate her symptoms. There are no other aggravating factors.  The history is provided by the patient. No language interpreter was used.    Past Medical History  Diagnosis Date  . PPD positive, treated 2008   Past Surgical History  Procedure Laterality Date  . Foot surgery     Family History  Problem Relation Age of Onset  . Hypertension Mother   . Diabetes Maternal Grandmother    History  Substance Use Topics  . Smoking status: Never Smoker   . Smokeless tobacco: Never Used  . Alcohol Use: No   OB History    Gravida Para Term Preterm AB TAB SAB Ectopic Multiple Living   3 3 2 1  0 0 0 0 0 3     Review of Systems  Constitutional: Negative for fever and chills.  Respiratory: Negative for shortness of breath.   Cardiovascular: Negative for chest pain.  Gastrointestinal: Negative for nausea, vomiting, diarrhea and constipation.  Genitourinary: Positive for vaginal discharge and dyspareunia. Negative for dysuria.  All other systems reviewed and are negative.     Allergies  Review of patient's allergies indicates no known allergies.  Home Medications   Prior to Admission medications   Medication Sig Start Date End Date Taking? Authorizing  Provider  albuterol (PROVENTIL HFA;VENTOLIN HFA) 108 (90 BASE) MCG/ACT inhaler Inhale 2 puffs into the lungs every 6 (six) hours as needed for wheezing or shortness of breath. Patient not taking: Reported on 09/20/2014 12/27/13   Minta BalsamMichael R Odom, MD   BP 121/60 mmHg  Pulse 80  Temp(Src) 98.4 F (36.9 C) (Oral)  Resp 18  Ht 5\' 2"  (1.575 m)  Wt 128 lb 3 oz (58.145 kg)  BMI 23.44 kg/m2  SpO2 100% Physical Exam  Constitutional: She is oriented to person, place, and time. She appears well-developed and well-nourished.  HENT:  Head: Normocephalic and atraumatic.  Eyes: Conjunctivae and EOM are normal. Pupils are equal, round, and reactive to light.  Neck: Normal range of motion. Neck supple.  Cardiovascular: Normal rate and regular rhythm.  Exam reveals no gallop and no friction rub.   No murmur heard. Pulmonary/Chest: Effort normal and breath sounds normal. No respiratory distress. She has no wheezes. She has no rales. She exhibits no tenderness.  Abdominal: Soft. Bowel sounds are normal. She exhibits no distension and no mass. There is no tenderness. There is no rebound and no guarding.  Genitourinary:  Pelvic exam chaperoned by female ER tech, no right or left adnexal tenderness, no uterine tenderness, mild dark vaginal discharge, no bleeding, no CMT or friability, no foreign body, no injury to the external genitalia, cervix appears rough and discolored  Musculoskeletal: Normal range of motion. She exhibits no edema or tenderness.  Neurological: She is alert and oriented to person, place, and time.  Skin:  Skin is warm and dry.  Psychiatric: She has a normal mood and affect. Her behavior is normal. Judgment and thought content normal.  Nursing note and vitals reviewed.   ED Course  Procedures (including critical care time) Results for orders placed or performed during the hospital encounter of 09/20/14  Wet prep, genital  Result Value Ref Range   Yeast Wet Prep HPF POC NONE SEEN NONE  SEEN   Trich, Wet Prep NONE SEEN NONE SEEN   Clue Cells Wet Prep HPF POC FEW (A) NONE SEEN   WBC, Wet Prep HPF POC FEW (A) NONE SEEN  Urinalysis, Routine w reflex microscopic  Result Value Ref Range   Color, Urine YELLOW YELLOW   APPearance CLEAR CLEAR   Specific Gravity, Urine 1.026 1.005 - 1.030   pH 6.0 5.0 - 8.0   Glucose, UA NEGATIVE NEGATIVE mg/dL   Hgb urine dipstick NEGATIVE NEGATIVE   Bilirubin Urine NEGATIVE NEGATIVE   Ketones, ur NEGATIVE NEGATIVE mg/dL   Protein, ur NEGATIVE NEGATIVE mg/dL   Urobilinogen, UA 0.2 0.0 - 1.0 mg/dL   Nitrite NEGATIVE NEGATIVE   Leukocytes, UA NEGATIVE NEGATIVE  POC Urine Pregnancy, ED (do NOT order at [facility])  Result Value Ref Range   Preg Test, Ur NEGATIVE NEGATIVE   No results found.   Imaging Review No results found.   EKG Interpretation None      MDM   Final diagnoses:  Abdominal cramps  Vaginal discharge    Patient with abdominal cramping and vaginal discharge. Thought to be secondary to new IUD. I reaffirmed this with on-call OB/GYN. Also discuss the the patient cervix appeared irregular to me, maybe HPV. OB/GYN will call the patient to schedule an appointment in the next couple of days for a follow-up visit. No other emergent workup needed. Patient feels well. She is well-appearing, not in any apparent distress.    Roxy Horsemanobert Juno Bozard, PA-C 09/20/14 1335  Benny LennertJoseph L Zammit, MD 09/21/14 [phone]

## 2014-09-21 LAB — GC/CHLAMYDIA PROBE AMP
CT PROBE, AMP APTIMA: NEGATIVE
GC Probe RNA: NEGATIVE

## 2014-09-26 ENCOUNTER — Encounter: Payer: Self-pay | Admitting: Obstetrics & Gynecology

## 2014-09-26 ENCOUNTER — Ambulatory Visit (INDEPENDENT_AMBULATORY_CARE_PROVIDER_SITE_OTHER): Payer: Medicaid Other | Admitting: Obstetrics & Gynecology

## 2014-09-26 VITALS — BP 114/70 | HR 56 | Temp 97.5°F | Ht 64.0 in | Wt 125.9 lb

## 2014-09-26 DIAGNOSIS — T192XXA Foreign body in vulva and vagina, initial encounter: Secondary | ICD-10-CM

## 2014-09-26 NOTE — Progress Notes (Signed)
   Subjective:    Patient ID: Brittney Ashley, female    DOB: 03/15/1986, 28 y.o.   MRN: [id number]  HPI [age] yo engaged AA P3 (11,7,5 months old kids) here today as a follow up from a ER visit. She was told by the PA at the ER that her cervix "looked dark". She was having old blood come out after sex. She also didn't like the smell. She had some cramping. Since that visit her pain has resolved.   Review of Systems     Objective:   Physical Exam Retained "dark" tampon. It was removed and her cervix appears normal. IUD strings seen.       Assessment & Plan:  Reassurance given regarding foul smell.

## 2014-11-19 ENCOUNTER — Emergency Department: Payer: Self-pay | Admitting: Emergency Medicine

## 2015-06-02 ENCOUNTER — Emergency Department (HOSPITAL_COMMUNITY)
Admission: EM | Admit: 2015-06-02 | Discharge: 2015-06-02 | Discharge status: Against Medical Advice | Payer: Medicaid Other | Attending: Emergency Medicine | Admitting: Emergency Medicine

## 2015-06-02 ENCOUNTER — Encounter (HOSPITAL_COMMUNITY): Payer: Self-pay | Admitting: *Deleted

## 2015-06-02 DIAGNOSIS — R1032 Left lower quadrant pain: Secondary | ICD-10-CM

## 2015-06-02 DIAGNOSIS — Z3202 Encounter for pregnancy test, result negative: Secondary | ICD-10-CM | POA: Diagnosis not present

## 2015-06-02 LAB — CBC
HCT: 33.4 % — ABNORMAL LOW (ref 36.0–46.0)
Hemoglobin: 11.3 g/dL — ABNORMAL LOW (ref 12.0–15.0)
MCH: 31.4 pg (ref 26.0–34.0)
MCHC: 33.8 g/dL (ref 30.0–36.0)
MCV: 92.8 fL (ref 78.0–100.0)
Platelets: 289 K/uL (ref 150–400)
RBC: 3.6 MIL/uL — ABNORMAL LOW (ref 3.87–5.11)
RDW: 13.9 % (ref 11.5–15.5)
WBC: 5.3 K/uL (ref 4.0–10.5)

## 2015-06-02 LAB — COMPREHENSIVE METABOLIC PANEL
ALBUMIN: 4.1 g/dL (ref 3.5–5.0)
ALK PHOS: 35 U/L — AB (ref 38–126)
ALT: 21 U/L (ref 14–54)
AST: 21 U/L (ref 15–41)
Anion gap: 6 (ref 5–15)
BUN: 9 mg/dL (ref 6–20)
CO2: 27 mmol/L (ref 22–32)
Calcium: 8.8 mg/dL — ABNORMAL LOW (ref 8.9–10.3)
Chloride: 102 mmol/L (ref 101–111)
Creatinine, Ser: 0.66 mg/dL (ref 0.44–1.00)
GFR calc Af Amer: >60 mL/min (ref >60)
GFR calc non Af Amer: >60 mL/min (ref >60)
GLUCOSE: 94 mg/dL (ref 65–99)
POTASSIUM: 3.4 mmol/L — AB (ref 3.5–5.1)
SODIUM: 135 mmol/L (ref 135–145)
Total Bilirubin: 0.8 mg/dL (ref 0.3–1.2)
Total Protein: 6.8 g/dL (ref 6.5–8.1)

## 2015-06-02 LAB — URINALYSIS, ROUTINE W REFLEX MICROSCOPIC
Bilirubin Urine: NEGATIVE
Glucose, UA: NEGATIVE mg/dL
Hgb urine dipstick: NEGATIVE
Ketones, ur: NEGATIVE mg/dL
Leukocytes, UA: NEGATIVE
Nitrite: NEGATIVE
Protein, ur: NEGATIVE mg/dL
Specific Gravity, Urine: 1.028 (ref 1.005–1.030)
Urobilinogen, UA: 0.2 mg/dL (ref 0.0–1.0)
pH: 7 (ref 5.0–8.0)

## 2015-06-02 LAB — I-STAT BETA HCG BLOOD, ED (MC, WL, AP ONLY): I-stat hCG, quantitative: <5 m[IU]/mL (ref <5)

## 2015-06-02 LAB — WET PREP, GENITAL
Clue Cells Wet Prep HPF POC: NONE SEEN
Trich, Wet Prep: NONE SEEN

## 2015-06-02 LAB — POC URINE PREG, ED: Preg Test, Ur: NEGATIVE

## 2015-06-02 LAB — LIPASE, BLOOD: Lipase: 22 U/L (ref 22–51)

## 2015-06-02 NOTE — ED Provider Notes (Signed)
CSN: [id number]     Arrival date & time 06/02/15  1522 History   First MD Initiated Contact with Patient 06/02/15 1825     Chief Complaint  Patient presents with  . Abdominal Pain     (Consider location/radiation/quality/duration/timing/severity/associated sxs/prior Treatment) Patient is a 29 y.o. female presenting with abdominal pain. The history is provided by the patient. No language interpreter was used.  Abdominal Pain Pain location:  LLQ Pain quality: sharp   Pain radiates to:  Does not radiate Pain severity:  Moderate Onset quality:  Sudden Duration:  2 days Timing:  Intermittent Progression:  Waxing and waning Chronicity:  New Associated symptoms: no vaginal bleeding and no vaginal discharge     Past Medical History  Diagnosis Date  . PPD positive, treated 2008   Past Surgical History  Procedure Laterality Date  . Foot surgery     Family History  Problem Relation Age of Onset  . Hypertension Mother   . Diabetes Maternal Grandmother    Social History  Substance Use Topics  . Smoking status: Never Smoker   . Smokeless tobacco: Never Used  . Alcohol Use: No   OB History    Gravida Para Term Preterm AB TAB SAB Ectopic Multiple Living   3 3 2 1  0 0 0 0 0 3     Review of Systems  Gastrointestinal: Positive for abdominal pain.  Genitourinary: Negative for vaginal bleeding and vaginal discharge.  All other systems reviewed and are negative.     Allergies  Review of patient's allergies indicates no known allergies.  Home Medications   Prior to Admission medications   Not on File   BP 113/64 mmHg  Pulse 76  Temp(Src) 98.7 F (37.1 C) (Oral)  Resp 16  SpO2 100%  LMP 05/23/2015 Physical Exam  Constitutional: She is oriented to person, place, and time. She appears well-developed and well-nourished.  HENT:  Head: Normocephalic.  Eyes: Conjunctivae are normal.  Neck: Neck supple.  Cardiovascular: Normal rate and regular rhythm.   Pulmonary/Chest:  Effort normal and breath sounds normal.  Abdominal: Soft. Bowel sounds are normal. There is tenderness.  Genitourinary: There is no rash, tenderness or lesion on the right labia. There is no rash, tenderness or lesion on the left labia. Cervix exhibits discharge. Cervix exhibits no motion tenderness and no friability. Right adnexum displays no mass. Left adnexum displays tenderness. Left adnexum displays no mass. No erythema or tenderness in the vagina.  Musculoskeletal: She exhibits no edema or tenderness.  Neurological: She is alert and oriented to person, place, and time.  Skin: Skin is warm and dry.  Psychiatric: She has a normal mood and affect.  Nursing note and vitals reviewed.   ED Course  Procedures (including critical care time) Labs Review Labs Reviewed  WET PREP, GENITAL - Abnormal; Notable for the following:    Yeast Wet Prep HPF POC FEW (*)    WBC, Wet Prep HPF POC FEW (*)    All other components within normal limits  COMPREHENSIVE METABOLIC PANEL - Abnormal; Notable for the following:    Potassium 3.4 (*)    Calcium 8.8 (*)    Alkaline Phosphatase 35 (*)    All other components within normal limits  CBC - Abnormal; Notable for the following:    RBC 3.60 (*)    Hemoglobin 11.3 (*)    HCT 33.4 (*)    All other components within normal limits  LIPASE, BLOOD  URINALYSIS, ROUTINE W REFLEX MICROSCOPIC (NOT  AT [facility])  I-STAT BETA HCG BLOOD, ED ([facility], [facility], [facility] ONLY)  POC URINE PREG, ED  I-STAT BETA HCG BLOOD, ED ([facility], [facility], [facility] ONLY)  GC/CHLAMYDIA PROBE AMP ([place]) NOT AT [facility]    Imaging Review No results found. I have personally reviewed and evaluated these images and lab results as part of my medical decision-making.   EKG Interpretation None     Patient apparently left AMA prior to completion of diagnostic testing. MDM   Final diagnoses:  None    LLQ pain.    Felicie Morn, NP 06/03/15 0981  Lavera Guise, MD 06/03/15 1321

## 2015-06-02 NOTE — ED Notes (Signed)
Pt left AMA. Pt told secretary that she was leaving, and left shortly thereafter. RN assigned to patient was in another room giving medications and unable to speak with the patient before she left. Provider notified.

## 2015-06-02 NOTE — ED Notes (Signed)
Pelvic Cart at Bedside. 

## 2015-06-02 NOTE — ED Notes (Signed)
Pt reports intermittent LLQ pain since yesterday with nausea. Denies vomiting or diarrhea.

## 2015-06-05 LAB — GC/CHLAMYDIA PROBE AMP (~~LOC~~) NOT AT ARMC
Chlamydia: NEGATIVE
Neisseria Gonorrhea: NEGATIVE

## 2015-07-28 ENCOUNTER — Ambulatory Visit: Payer: Medicaid Other | Admitting: Medical

## 2015-08-11 ENCOUNTER — Telehealth: Payer: Self-pay | Admitting: General Practice

## 2015-08-11 NOTE — Telephone Encounter (Signed)
Patient called and left message stating she has an appt on Monday and doesn't know if she needs to be seen sooner. Patient states she is having sharp lower back pain on her right side and doesn't know if this could be from her mirena or a bad UTI. Patient also states that she is having a pap smear on Monday and her breast has been leaking and getting bigger. Patient states she took a pregnancy test and it was negative. Called patient and discussed that her breast problem could wait until her appt on Monday but if her back pain was severe enough she should go to an urgent care. Patient verbalized understanding and had no questions

## 2015-08-12 ENCOUNTER — Encounter: Payer: Self-pay | Admitting: Emergency Medicine

## 2015-08-12 ENCOUNTER — Emergency Department
Admission: EM | Admit: 2015-08-12 | Discharge: 2015-08-12 | Disposition: A | Discharge status: Home / Self Care | Payer: BLUE CROSS/BLUE SHIELD | Attending: Emergency Medicine | Admitting: Emergency Medicine

## 2015-08-12 DIAGNOSIS — Z3202 Encounter for pregnancy test, result negative: Secondary | ICD-10-CM | POA: Diagnosis not present

## 2015-08-12 DIAGNOSIS — N390 Urinary tract infection, site not specified: Secondary | ICD-10-CM

## 2015-08-12 DIAGNOSIS — R350 Frequency of micturition: Secondary | ICD-10-CM | POA: Diagnosis present

## 2015-08-12 DIAGNOSIS — N6452 Nipple discharge: Secondary | ICD-10-CM | POA: Diagnosis not present

## 2015-08-12 LAB — URINALYSIS COMPLETE WITH MICROSCOPIC (ARMC ONLY)
Bacteria, UA: NONE SEEN
Bilirubin Urine: NEGATIVE
Glucose, UA: NEGATIVE mg/dL
Hgb urine dipstick: NEGATIVE
Ketones, ur: NEGATIVE mg/dL
Leukocytes, UA: NEGATIVE
Nitrite: NEGATIVE
Protein, ur: NEGATIVE mg/dL
Specific Gravity, Urine: 1.024 (ref 1.005–1.030)
pH: 6 (ref 5.0–8.0)

## 2015-08-12 LAB — POCT PREGNANCY, URINE: Preg Test, Ur: NEGATIVE

## 2015-08-12 MED ORDER — SULFAMETHOXAZOLE-TRIMETHOPRIM 800-160 MG PO TABS: 1.0000 | ORAL_TABLET | Freq: Two times a day (BID) | ORAL | Status: DC

## 2015-08-12 NOTE — ED Notes (Signed)
Pt reports right sided flank pain x3 weeks; reports urinary frequency, reports the past 2 days she has been lactating from her breast, thinks this has something to do with IUD. Reports taking home pregnancy test negative,
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
## 2015-08-12 NOTE — Discharge Instructions (Signed)
Increase fluids. Take all the antibiotics until completely finished in 10 days. [facility] [place] if any continued problems with lactation from the right breast. Return to the emergency room if any severe worsening of her symptoms.

## 2015-08-14 ENCOUNTER — Encounter: Payer: Self-pay | Admitting: Medical

## 2015-08-14 ENCOUNTER — Ambulatory Visit (INDEPENDENT_AMBULATORY_CARE_PROVIDER_SITE_OTHER): Payer: BLUE CROSS/BLUE SHIELD | Admitting: Medical

## 2015-08-14 VITALS — BP 114/71 | HR 102 | Temp 98.4°F | Wt 119.7 lb

## 2015-08-14 DIAGNOSIS — O9279 Other disorders of lactation: Secondary | ICD-10-CM | POA: Diagnosis not present

## 2015-08-14 DIAGNOSIS — R87619 Unspecified abnormal cytological findings in specimens from cervix uteri: Secondary | ICD-10-CM

## 2015-08-14 NOTE — Progress Notes (Signed)
Patient ID: Foy GuadalajaraShakirah L Hatfield, female   DOB: 08/10/1986, 29 y.o.   MRN: [id number]  Subjective:    Foy GuadalajaraShakirah L Hatfield is a 29 y.o. female who presents for an annual exam. The patient is sexually active. GYN screening history: last pap: approximate date 06/2014 and was normal. The patient wears seatbelts: yes. The patient participates in regular exercise: not asked. Has the patient ever been transfused or tattooed?: not asked. The patient reports that there is not domestic violence in her life. Patient states recent milk production. She denies recent breastfeeding. She first noted milky discharge from breast last week. No bloody discharge noted. Tenderness to touch of the bilateral nipples, otherwise no breast pain. Patient has IUD and would like to have placement checked today. [facility] and is being treated for a UTI.   Menstrual History: OB History    Gravida Para Term Preterm AB TAB SAB Ectopic Multiple Living   3 3 2 1  0 0 0 0 0 3      Menarche age: 29 years old  Patient's last menstrual period was 07/27/2015 (approximate).    The following portions of the patient's history were reviewed and updated as appropriate: allergies, current medications, past family history, past medical history, past social history, past surgical history and problem list.  Review of Systems Pertinent items are noted in HPI.       Objective:     Physical Exam  Constitutional: She is oriented to person, place, and time. She appears well-developed and well-nourished. No distress.  HENT:  Head: Normocephalic.  Cardiovascular: Normal rate, regular rhythm and normal heart sounds.   Pulmonary/Chest: Effort normal and breath sounds normal. No respiratory distress. Right breast exhibits no inverted nipple, no mass, no nipple discharge and no skin change. Left breast exhibits no inverted nipple, no mass, no nipple discharge and no skin change. Breasts are symmetrical. There is no breast  swelling.  Abdominal: Soft. She exhibits no distension. There is no tenderness.  Genitourinary: Vagina normal. No breast tenderness, discharge or bleeding. No vaginal discharge found.  IUD string visualized and appear appropriate length  Neurological: She is alert and oriented to person, place, and time.  Skin: Skin is warm and dry. No erythema.  Psychiatric: She has a normal mood and affect. Her behavior is normal.  Nursing note and vitals reviewed.    Assessment:    Healthy female exam.   IUD check  Lactation in non-breastfeeding female  Plan:    1. Prolactin level drawn today, if normal will consider imaging of the breasts 2. [facility] for annual exam or sooner PRN  Marny LowensteinJulie N Marline Morace, PA-C 08/14/2015 3:24 PM

## 2015-08-14 NOTE — Patient Instructions (Addendum)
Pap Test WHY AM I HAVING THIS TEST? A pap test is sometimes called a pap smear. It is a screening test that is used to check for signs of cancer of the vagina, cervix, and uterus. The test can also identify the presence of infection or precancerous changes. Your health care provider will likely recommend you have this test done on a regular basis. This test may be done:  Every 3 years, starting at age 30.  Every 5 years, in combination with testing for the presence of human papillomavirus (HPV).  More or less often depending on other medical conditions.  WHAT KIND OF SAMPLE IS TAKEN? Using a small cotton swab, plastic spatula, or brush, your health care provider will collect a sample of cells from the surface of your cervix. Your cervix is the opening to your uterus, also called a womb. Secretions from the cervix and vagina may also be collected. HOW DO I PREPARE FOR THE TEST?  Be aware of where you are in your menstrual cycle. You may be asked to reschedule the test if you are menstruating on the day of the test.  You may need to reschedule if you have a known vaginal infection on the day of the test.  You may be asked to avoid douching or taking a bath the day before or the day of the test.  Some medicines can cause abnormal test results, such as digitalis and tetracycline. Talk with your health care provider before your test if you take one of these medicines. WHAT DO THE RESULTS MEAN? Abnormal test results may indicate a number of health conditions. These may include:  Cancer. Although pap test results cannot be used to diagnose cancer of the cervix, vagina, or uterus, they may suggest the possibility of cancer. Further tests would be required to determine if cancer is present.  Sexually transmitted disease.  Fungal infection.  Parasite infection.  Herpes infection.  A condition causing or contributing to infertility. It is your responsibility to obtain your test results. Ask  the lab or department performing the test when and how you will get your results. Contact your health care provider to discuss any questions you have about your results.   This information is not intended to replace advice given to you by your health care provider. Make sure you discuss any questions you have with your health care provider.   Document Released: 12/14/2002 Document Revised: 10/14/2014 Document Reviewed: 02/14/2014 Elsevier Interactive Patient Education 2016 [employer] Galactorrhea  Galactorrhea is the flow of a milky fluid (discharge) from the breast. It is different from normal milk in nursing mothers. It can be caused by many things. Most cases are not serious and do not require treatment. Watch your condition to make sure it goes away. HOME CARE Take medicines only as told by your doctor. Do not squeeze your breasts or nipples. Avoid any touching of your breasts during sexual activity. Perform a breast self-exam only once a month. Avoid clothes that rub on your nipples. Use breast pads to absorb the fluid. Wear a support bra or a breast binder. Keep all follow-up visits as told by your doctor. This is important. GET HELP IF: You have hot flashes. You have vaginal dryness. You have a lack of sexual desire. You stop having menstrual periods, or they are far apart or not regular. You have headaches. You have vision problems. GET HELP RIGHT AWAY IF: Your breast discharge is bloody or yellowish white (puslike). You have breast pain. You  feel a lump in your breast. Your breast shows wrinkling or dimpling. Your breast becomes red and swollen.   This information is not intended to replace advice given to you by your health care provider. Make sure you discuss any questions you have with your health care provider.   Document Released: 10/26/2010 Document Revised: 10/14/2014 Document Reviewed: 04/26/2014 Elsevier Interactive Patient Education [employer]

## 2015-08-15 LAB — PROLACTIN: Prolactin: 12.6 ng/mL

## 2015-08-16 ENCOUNTER — Other Ambulatory Visit: Payer: Self-pay | Admitting: Medical

## 2015-08-16 ENCOUNTER — Telehealth: Payer: Self-pay | Admitting: *Deleted

## 2015-08-16 DIAGNOSIS — N643 Galactorrhea not associated with childbirth: Secondary | ICD-10-CM

## 2015-08-16 NOTE — Telephone Encounter (Signed)
Per Brittney FanningJulie patients labs are normal and she will need to have bilateral breast ultrasounds and mammogram if indicated. Called patient and informed her of results. Patient prefers a morning appointment M-Th or anytime on Friday.

## 2015-08-16 NOTE — Telephone Encounter (Signed)
Pt informed of appointments.

## 2015-08-16 NOTE — Telephone Encounter (Signed)
Breast center called. They are recommending that she have mammogram, Ultrasound and ductogram. Scheduled for Friday Nov 18 at 1 pm. Pt will have mammogram first, then ultrasound and then if needed a ductogram. Called patient and left message to call us back and let us know if its ok to leave a detailed message.

## 2015-08-23 ENCOUNTER — Other Ambulatory Visit: Payer: Self-pay

## 2015-08-23 ENCOUNTER — Other Ambulatory Visit: Payer: Self-pay | Admitting: Medical

## 2015-08-23 DIAGNOSIS — N643 Galactorrhea not associated with childbirth: Secondary | ICD-10-CM

## 2015-08-25 ENCOUNTER — Other Ambulatory Visit: Payer: BLUE CROSS/BLUE SHIELD

## 2015-09-06 ENCOUNTER — Encounter: Payer: Self-pay | Admitting: Urgent Care

## 2015-09-06 ENCOUNTER — Emergency Department: Payer: BLUE CROSS/BLUE SHIELD

## 2015-09-06 DIAGNOSIS — R079 Chest pain, unspecified: Secondary | ICD-10-CM | POA: Insufficient documentation

## 2015-09-06 DIAGNOSIS — Z792 Long term (current) use of antibiotics: Secondary | ICD-10-CM | POA: Diagnosis not present

## 2015-09-06 LAB — BASIC METABOLIC PANEL
Anion gap: 5 (ref 5–15)
BUN: 16 mg/dL (ref 6–20)
CHLORIDE: 105 mmol/L (ref 101–111)
CO2: 27 mmol/L (ref 22–32)
CREATININE: 0.63 mg/dL (ref 0.44–1.00)
Calcium: 9 mg/dL (ref 8.9–10.3)
GFR calc Af Amer: >60 mL/min (ref >60)
GLUCOSE: 93 mg/dL (ref 65–99)
Potassium: 3.8 mmol/L (ref 3.5–5.1)
SODIUM: 137 mmol/L (ref 135–145)

## 2015-09-06 LAB — CBC
HCT: 33.7 % — ABNORMAL LOW (ref 35.0–47.0)
Hemoglobin: 11.3 g/dL — ABNORMAL LOW (ref 12.0–16.0)
MCH: 31.5 pg (ref 26.0–34.0)
MCHC: 33.6 g/dL (ref 32.0–36.0)
MCV: 93.8 fL (ref 80.0–100.0)
PLATELETS: 371 10*3/uL (ref 150–440)
RBC: 3.59 MIL/uL — ABNORMAL LOW (ref 3.80–5.20)
RDW: 13.8 % (ref 11.5–14.5)
WBC: 7.1 10*3/uL (ref 3.6–11.0)

## 2015-09-06 LAB — TROPONIN I

## 2015-09-06 NOTE — ED Notes (Signed)
Patient presents with c/o LEFT chest pain with intermittent radiation into LEFT shoulder and down her LEFT arm. Patient denies N/V, SOB, and diaphoresis. (+) vertiginous symptoms reported.

## 2015-09-07 ENCOUNTER — Emergency Department
Admission: EM | Admit: 2015-09-07 | Discharge: 2015-09-07 | Disposition: A | Discharge status: Home / Self Care | Payer: BLUE CROSS/BLUE SHIELD | Attending: Emergency Medicine | Admitting: Emergency Medicine

## 2015-09-07 DIAGNOSIS — R079 Chest pain, unspecified: Secondary | ICD-10-CM

## 2015-09-07 HISTORY — DX: Unspecified asthma, uncomplicated: J45.909

## 2015-09-07 LAB — FIBRIN DERIVATIVES D-DIMER (ARMC ONLY): Fibrin derivatives D-dimer (ARMC): 73 (ref 0–499)
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
## 2015-09-07 NOTE — Discharge Instructions (Signed)
Nonspecific Chest Pain  °Chest pain can be caused by many different conditions. There is always a chance that your pain could be related to something serious, such as a heart attack or a blood clot in your lungs. Chest pain can also be caused by conditions that are not life-threatening. If you have chest pain, it is very important to follow up with your health care provider. °CAUSES  °Chest pain can be caused by: °· Heartburn. °· Pneumonia or bronchitis. °· Anxiety or stress. °· Inflammation around your heart (pericarditis) or lung (pleuritis or pleurisy). °· A blood clot in your lung. °· A collapsed lung (pneumothorax). It can develop suddenly on its own (spontaneous pneumothorax) or from trauma to the chest. °· Shingles infection (varicella-zoster virus). °· Heart attack. °· Damage to the bones, muscles, and cartilage that make up your chest wall. This can include: °¨ Bruised bones due to injury. °¨ Strained muscles or cartilage due to frequent or repeated coughing or overwork. °¨ Fracture to one or more ribs. °¨ Sore cartilage due to inflammation (costochondritis). °RISK FACTORS  °Risk factors for chest pain may include: °· Activities that increase your risk for trauma or injury to your chest. °· Respiratory infections or conditions that cause frequent coughing. °· Medical conditions or overeating that can cause heartburn. °· Heart disease or family history of heart disease. °· Conditions or health behaviors that increase your risk of developing a blood clot. °· Having had chicken pox (varicella zoster). °SIGNS AND SYMPTOMS °Chest pain can feel like: °· Burning or tingling on the surface of your chest or deep in your chest. °· Crushing, pressure, aching, or squeezing pain. °· Dull or sharp pain that is worse when you move, cough, or take a deep breath. °· Pain that is also felt in your back, neck, shoulder, or arm, or pain that spreads to any of these areas. °Your chest pain may come and go, or it may stay  constant. °DIAGNOSIS °Lab tests or other studies may be needed to find the cause of your pain. Your health care provider may have you take a test called an ambulatory ECG (electrocardiogram). An ECG records your heartbeat patterns at the time the test is performed. You may also have other tests, such as: °· Transthoracic echocardiogram (TTE). During echocardiography, sound waves are used to create a picture of all of the heart structures and to look at how blood flows through your heart. °· Transesophageal echocardiogram (TEE). This is a more advanced imaging test that obtains images from inside your body. It allows your health care provider to see your heart in finer detail. °· Cardiac monitoring. This allows your health care provider to monitor your heart rate and rhythm in real time. °· Holter monitor. This is a portable device that records your heartbeat and can help to diagnose abnormal heartbeats. It allows your health care provider to track your heart activity for several days, if needed. °· Stress tests. These can be done through exercise or by taking medicine that makes your heart beat more quickly. °· Blood tests. °· Imaging tests. °TREATMENT  °Your treatment depends on what is causing your chest pain. Treatment may include: °· Medicines. These may include: °¨ Acid blockers for heartburn. °¨ Anti-inflammatory medicine. °¨ Pain medicine for inflammatory conditions. °¨ Antibiotic medicine, if an infection is present. °¨ Medicines to dissolve blood clots. °¨ Medicines to treat coronary artery disease. °· Supportive care for conditions that do not require medicines. This may include: °¨ Resting. °¨ Applying heat   or cold packs to injured areas. °¨ Limiting activities until pain decreases. °HOME CARE INSTRUCTIONS °· If you were prescribed an antibiotic medicine, finish it all even if you start to feel better. °· Avoid any activities that bring on chest pain. °· Do not use any tobacco products, including  cigarettes, chewing tobacco, or electronic cigarettes. If you need help quitting, ask your health care provider. °· Do not drink alcohol. °· Take medicines only as directed by your health care provider. °· Keep all follow-up visits as directed by your health care provider. This is important. This includes any further testing if your chest pain does not go away. °· If heartburn is the cause for your chest pain, you may be told to keep your head raised (elevated) while sleeping. This reduces the chance that acid will go from your stomach into your esophagus. °· Make lifestyle changes as directed by your health care provider. These may include: °¨ Getting regular exercise. Ask your health care provider to suggest some activities that are safe for you. °¨ Eating a heart-healthy diet. A registered dietitian can help you to learn healthy eating options. °¨ Maintaining a healthy weight. °¨ Managing diabetes, if necessary. °¨ Reducing stress. °SEEK MEDICAL CARE IF: °· Your chest pain does not go away after treatment. °· You have a rash with blisters on your chest. °· You have a fever. °SEEK IMMEDIATE MEDICAL CARE IF:  °· Your chest pain is worse. °· You have an increasing cough, or you cough up blood. °· You have severe abdominal pain. °· You have severe weakness. °· You faint. °· You have chills. °· You have sudden, unexplained chest discomfort. °· You have sudden, unexplained discomfort in your arms, back, neck, or jaw. °· You have shortness of breath at any time. °· You suddenly start to sweat, or your skin gets clammy. °· You feel nauseous or you vomit. °· You suddenly feel light-headed or dizzy. °· Your heart begins to beat quickly, or it feels like it is skipping beats. °These symptoms may represent a serious problem that is an emergency. Do not wait to see if the symptoms will go away. Get medical help right away. Call your local emergency services (911 in the [place] Do not drive yourself to the hospital. °  °This  information is not intended to replace advice given to you by your health care provider. Make sure you discuss any questions you have with your health care provider. °  °Document Released: 07/03/2005 Document Revised: 10/14/2014 Document Reviewed: 04/29/2014 °Elsevier Interactive Patient Education ©2016 [employer] ° °

## 2015-09-07 NOTE — ED Notes (Signed)
Patient discharged to home per MD order. Patient in stable condition, and deemed medically cleared by ED provider for discharge. Discharge instructions reviewed with patient/family using "Teach Back"; verbalized understanding of medication education and administration, and information about follow-up care. Denies further concerns. ° °

## 2016-01-04 ENCOUNTER — Ambulatory Visit: Payer: BLUE CROSS/BLUE SHIELD | Admitting: Certified Nurse Midwife

## 2016-01-15 ENCOUNTER — Encounter: Payer: Self-pay | Admitting: Obstetrics and Gynecology

## 2016-01-15 ENCOUNTER — Ambulatory Visit (INDEPENDENT_AMBULATORY_CARE_PROVIDER_SITE_OTHER): Payer: BLUE CROSS/BLUE SHIELD | Admitting: Obstetrics and Gynecology

## 2016-01-15 VITALS — BP 126/74 | HR 78 | Wt 121.0 lb

## 2016-01-15 DIAGNOSIS — Z30432 Encounter for removal of intrauterine contraceptive device: Secondary | ICD-10-CM

## 2016-01-15 DIAGNOSIS — Z3042 Encounter for surveillance of injectable contraceptive: Secondary | ICD-10-CM | POA: Diagnosis not present

## 2016-01-15 DIAGNOSIS — Z309 Encounter for contraceptive management, unspecified: Secondary | ICD-10-CM

## 2016-01-15 MED ORDER — MEDROXYPROGESTERONE ACETATE 150 MG/ML IM SUSP: 150.0000 mg | INTRAMUSCULAR | Status: DC

## 2016-01-15 MED ORDER — MEDROXYPROGESTERONE ACETATE 150 MG/ML IM SUSP
150.0000 mg | Freq: Once | INTRAMUSCULAR | Status: AC
  Administered 2016-01-15: 150 mg via INTRAMUSCULAR

## 2016-01-15 NOTE — Progress Notes (Signed)
CLINIC ENCOUNTER NOTE  History:  30 y.o. [id number] here today for iud removal. Placed in 2015. Recent occasional cramping. More bleeding recently than before - was having regular periods. Pain with deep penetration. No history blood clot, no history liver disease, no cancer history, non-smoker, no migraine w/ aura.   Past Medical History  Diagnosis Date  . PPD positive, treated 2008  . Asthma     Past Surgical History  Procedure Laterality Date  . Foot surgery      The following portions of the patient's history were reviewed and updated as appropriate: allergies, current medications, past family history, past medical history, past social history, past surgical history and problem list.   Health Maintenance:  Normal pap 06/2014.  Review of Systems:  See above; comprehensive review of systems was otherwise negative.  Objective:  Physical Exam BP 126/74 mmHg  Pulse 78  Wt 121 lb (54.885 kg)  LMP 01/01/2016  Breastfeeding? No CONSTITUTIONAL: Well-developed, well-nourished female in no acute distress.  HENT:  Normocephalic, atraumatic SKIN: Skin is warm and dry.  NEUROLGIC: Alert  PSYCHIATRIC: Normal mood and affect.  CARDIOVASCULAR: Normal heart rate noted RESPIRATORY: Effort and breath sounds normal, no problems with respiration noted ABDOMEN: Soft, no distention noted.  No tenderness, rebound or guarding.  PELVIC: normal vagina, normal cervix  Procedure iud removal Speculum inserted iud strings visualized Grapsed with ring forceps Gentle traction, iud removed in toto No bleeding, no complications, pt tolerated procedure well   Labs and Imaging No results found.  Assessment & Plan:   # Contraceptive management - iud removed today, explained no guarantee current symptoms will dissipate, but patient clear she wants it removed - nexplanon discussed, patient declined - opts to resume depo, no absolute contraindications - f/u according to depo schedule  Routine  preventative health maintenance measures emphasized.     Sameerah Nachtigal B. Jadarius Commons, MD OB/GYN Fellow Center [facility], [facility]

## 2016-01-15 NOTE — Addendum Note (Signed)
Addended by: Faythe CasaBELLAMY, JEANETTA M on: 01/15/2016 03:09 PM   Modules accepted: Orders

## 2016-01-17 ENCOUNTER — Encounter: Payer: Self-pay | Admitting: *Deleted

## 2016-03-26 ENCOUNTER — Ambulatory Visit
Admission: EM | Admit: 2016-03-26 | Discharge: 2016-03-26 | Disposition: A | Discharge status: Home / Self Care | Payer: BLUE CROSS/BLUE SHIELD | Attending: Family Medicine | Admitting: Family Medicine

## 2016-03-26 ENCOUNTER — Telehealth: Payer: Self-pay | Admitting: *Deleted

## 2016-03-26 DIAGNOSIS — H8110 Benign paroxysmal vertigo, unspecified ear: Secondary | ICD-10-CM | POA: Diagnosis not present

## 2016-03-26 DIAGNOSIS — Z87820 Personal history of traumatic brain injury: Secondary | ICD-10-CM | POA: Diagnosis not present

## 2016-03-26 MED ORDER — ONDANSETRON 8 MG PO TBDP: 8.0000 mg | ORAL_TABLET | Freq: Three times a day (TID) | ORAL | Status: DC | PRN

## 2016-03-26 MED ORDER — MECLIZINE HCL 25 MG PO TABS: 25.0000 mg | ORAL_TABLET | Freq: Three times a day (TID) | ORAL | Status: DC | PRN

## 2016-03-26 NOTE — ED Provider Notes (Signed)
CSN: [id number]     Arrival date & time 03/26/16  1751 History   First MD Initiated Contact with Patient 03/26/16 1822    Nurses notes were reviewed. Chief Complaint  Patient presents with  . Dizziness   Patient reports progressive dizziness. 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 She went to bed her head to pick up something at work and also became lightheaded and dizzy. He also has some nausea accompanied with dizziness but no actual vomiting. She was seen at her workplace there was no orthostatic changes by the nurses there but the other thing that concerned her was that she had a significant head trauma on Saturday. She states she was at home and cut up a when she tripped over a case of water and fell back and hit head. She has a goose egg from that and of course she is worried that because of the fall she has Saturday that is contributing to the dizziness that she's having now.  Should be noted that she had no loss of consciousness but she does have some pain in regards to the swelling of the back of her forehead. She says she usually take the Prevacid test today just to make sure she was not pregnant she is on Depo-Provera  Past medical history history of positive PPD and asthma. She's had foot surgery as well. She is a gravida 3 para 3. Family history of hypertension and diabetes. She's never smoked. Until today she had no dizziness from the fall on Saturday.     (Consider location/radiation/quality/duration/timing/severity/associated sxs/prior Treatment) Patient is a 30 y.o. female presenting with dizziness. The history is provided by the patient. No language interpreter was used.  Dizziness Quality:  Lightheadedness and vertigo Severity:  Severe Onset quality:  Sudden Timing:  Intermittent Progression:  Worsening Chronicity:  New Context: bending over, head movement and physical  activity   Context: not with loss of consciousness and not when standing up   Relieved by:  Nothing Ineffective treatments:  Fluids Associated symptoms: headaches and nausea   Associated symptoms: no syncope, no vision changes and no vomiting     Past Medical History  Diagnosis Date  . PPD positive, treated 2008  . Asthma    Past Surgical History  Procedure Laterality Date  . Foot surgery     Family History  Problem Relation Age of Onset  . Hypertension Mother   . Diabetes Maternal Grandmother    Social History  Substance Use Topics  . Smoking status: Never Smoker   . Smokeless tobacco: Never Used  . Alcohol Use: No   OB History    Gravida Para Term Preterm AB TAB SAB Ectopic Multiple Living   3 3 2 1  0 0 0 0 0 3     Review of Systems  Cardiovascular: Negative for syncope.  Gastrointestinal: Positive for nausea. Negative for vomiting.  Neurological: Positive for dizziness and headaches.  All other systems reviewed and are negative.   Allergies  Review of patient's allergies indicates no known allergies.  Home Medications   Prior to Admission medications   Medication Sig Start Date End Date Taking? Authorizing Provider  meclizine (ANTIVERT) 25 MG tablet Take 1 tablet (25 mg total) by mouth 3 (three) times daily as needed for dizziness. 03/26/16   Hassan RowanEugene Edilberto Roosevelt, MD  ondansetron (ZOFRAN ODT) 8 MG disintegrating tablet Take 1 tablet (  8 mg total) by mouth every 8 (eight) hours as needed for nausea or vomiting. 03/26/16   Hassan Rowan, MD   Meds Ordered and Administered this Visit  Medications - No data to display  BP 111/70 mmHg  Pulse 75  Temp(Src) 98.6 F (37 C) (Oral)  Resp 16  Ht  (1.626 m)  Wt 119 lb (53.978 kg)  BMI 20.42 kg/m2  SpO2 100%  LMP 02/22/2016 (Approximate) No data found.   Physical Exam  Constitutional: She is oriented to person, place, and time. She appears well-developed and well-nourished.  HENT:  Head: Normocephalic. Head is with  contusion.    Right Ear: Hearing, tympanic membrane, external ear and ear canal normal.  Left Ear: Hearing, tympanic membrane, external ear and ear canal normal.  Nose: Nose normal. No mucosal edema or rhinorrhea.  Mouth/Throat: Uvula is midline and oropharynx is clear and moist. Normal dentition.  Eyes: Conjunctivae are normal. Pupils are equal, round, and reactive to light.  Neck: Normal range of motion. Neck supple.  Cardiovascular: Normal rate and regular rhythm.   Pulmonary/Chest: Effort normal and breath sounds normal.  Musculoskeletal: Normal range of motion. She exhibits no tenderness.  Lymphadenopathy:    She has cervical adenopathy.  Neurological: She is alert and oriented to person, place, and time.  Skin: Skin is warm and dry.  Psychiatric: She has a normal mood and affect. Her behavior is normal.  Vitals reviewed.   ED Course  Procedures (including critical care time)  Labs Review Labs Reviewed - No data to display  Imaging Review No results found.   Visual Acuity Review  Right Eye Distance:   Left Eye Distance:   Bilateral Distance:    Right Eye Near:   Left Eye Near:    Bilateral Near:         MDM   1. Vertigo, benign positional, unspecified laterality   2. History of recent traumatic injury of head      We discussed with patient about possibly getting a CT scan tonight of her head. We performed the Epley maneuver which improved her symptoms greatly. Explained to her with improvement of her symptoms this could just be vertigo from inner ear fluid dysfunction and that between the Antivert and Zofran may take care of the whole problem. Irises she's concerned about the head injury that she experienced Saturday once again there was no loss of consciousness. A left the final decision of the hers and she is opted to wait for the CT scan.   will place patient on Antivert 25 mg 1 tablet 3 times a day when necessary and Zofran 8 mg ODT. Work note given for  today and tomorrow. If she is not better by tomorrow or if symptoms are getting worse would recommend return for possible CT of the head. At this time because of her insurance coverage and her desire not to obtain a larger bill she wants to stop and since she responded well to the Epley maneuver I have no problem with that. With the understanding of course that if things get worse will come back for reevaluation and possible CT of the head at that time. Also stressed to her that she needs to be off work more than today and tomorrow she will need to come back in for reevaluation as well.    Hassan Rowan, MD 03/26/16 [phone]

## 2016-03-26 NOTE — Telephone Encounter (Signed)
Pt left message stating that she has been having light headedness and nausea. She has taken a home UPT which is negative. She wants to know if she needs to be seen. She also stated that she fell backwards over the weekend and bumped her head. I returned pt's call and left message on voice mail that she needs evaluation @ an Urgent care facility or regular Emergency Dept today. Her symptoms may be related to the fall and need to be evaluated in person.

## 2016-03-26 NOTE — ED Notes (Signed)
Patient complains of dizzy, lightheadness. Patient states that she fell at home on Saturday afternoon and hit the back of her head on her kitchen table. She states that she did not lose conciousness. Patient states that since then she has been having episodes of dizziness followed by nausea. She states that she was at work tonight and felt unsafe after having episode. 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 States that she also had an episode while working out this morning.

## 2016-03-26 NOTE — Discharge Instructions (Signed)
Dizziness Dizziness is a common problem. It makes you feel unsteady or lightheaded. You may feel like you are about to pass out (faint). Dizziness can lead to injury if you stumble or fall. Anyone can get dizzy, but dizziness is more common in older adults. This condition can be caused by a number of things, including:  Medicines.  Dehydration.  Illness. HOME CARE Following these instructions may help with your condition: Eating and Drinking  Drink enough fluid to keep your pee (urine) clear or pale yellow. This helps to keep you from getting dehydrated. Try to drink more clear fluids, such as water.  Do not drink alcohol.  Limit how much caffeine you drink or eat if told by your doctor.  Limit how much salt you drink or eat if told by your doctor. Activity  Avoid making quick movements.  When you stand up from sitting in a chair, steady yourself until you feel okay.  In the morning, first sit up on the side of the bed. When you feel okay, stand slowly while you hold onto something. Do this until you know that your balance is fine.  Move your legs often if you need to stand in one place for a long time. Tighten and relax your muscles in your legs while you are standing.  Do not drive or use heavy machinery if you feel dizzy.  Avoid bending down if you feel dizzy. Place items in your home so that they are easy for you to reach without leaning over. Lifestyle  Do not use any tobacco products, including cigarettes, chewing tobacco, or electronic cigarettes. If you need help quitting, ask your doctor.  Try to lower your stress level, such as with yoga or meditation. Talk with your doctor if you need help. General Instructions  Watch your dizziness for any changes.  Take medicines only as told by your doctor. Talk with your doctor if you think that your dizziness is caused by a medicine that you are taking.  Tell a friend or a family member that you are feeling dizzy. If he or  she notices any changes in your behavior, have this person call your doctor.  Keep all follow-up visits as told by your doctor. This is important. GET HELP IF:  Your dizziness does not go away.  Your dizziness or light-headedness gets worse.  You feel sick to your stomach (nauseous).  You have trouble hearing.  You have new symptoms.  You are unsteady on your feet or you feel like the room is spinning. GET HELP RIGHT AWAY IF:  You throw up (vomit) or have diarrhea and are unable to eat or drink anything.  You have trouble:  Talking.  Walking.  Swallowing.  Using your arms, hands, or legs.  You feel generally weak.  You are not thinking clearly or you have trouble forming sentences. It may take a friend or family member to notice this.  You have:  Chest pain.  Pain in your belly (abdomen).  Shortness of breath.  Sweating.  Your vision changes.  You are bleeding.  You have a headache.  You have neck pain or a stiff neck.  You have a fever.   This information is not intended to replace advice given to you by your health care provider. Make sure you discuss any questions you have with your health care provider.   Document Released: 09/12/2011 Document Revised: 02/07/2015 Document Reviewed: 09/19/2014 Elsevier Interactive Patient Education 2016 [employer].  Teaching laboratory technician Self-Care WHAT  IS THE EPLEY MANEUVER? The Epley maneuver is an exercise you can do to relieve symptoms of benign paroxysmal positional vertigo (BPPV). This condition is often just referred to as vertigo. BPPV is caused by the movement of tiny crystals (canaliths) inside your inner ear. The accumulation and movement of canaliths in your inner ear causes a sudden spinning sensation (vertigo) when you move your head to certain positions. Vertigo usually lasts about 30 seconds. BPPV usually occurs in just one ear. If you get vertigo when you lie on your left side, you probably have BPPV in your  left ear. Your health care provider can tell you which ear is involved.  BPPV may be caused by a head injury. Many people older than 50 get BPPV for unknown reasons. If you have been diagnosed with BPPV, your health care provider may teach you how to do this maneuver. BPPV is not life threatening (benign) and usually goes away in time.  WHEN SHOULD I PERFORM THE EPLEY MANEUVER? You can do this maneuver at home whenever you have symptoms of vertigo. You may do the Epley maneuver up to 3 times a day until your symptoms of vertigo go away. HOW SHOULD I DO THE EPLEY MANEUVER?  Sit on the edge of a bed or table with your back straight. Your legs should be extended or hanging over the edge of the bed or table.   Turn your head halfway toward the affected ear.   Lie backward quickly with your head turned until you are lying flat on your back. You may want to position a pillow under your shoulders.   Hold this position for 30 seconds. You may experience an attack of vertigo. This is normal. Hold this position until the vertigo stops.  Then turn your head to the opposite direction until your unaffected ear is facing the floor.   Hold this position for 30 seconds. You may experience an attack of vertigo. This is normal. Hold this position until the vertigo stops.  Now turn your whole body to the same side as your head. Hold for another 30 seconds.   You can then sit back up. ARE THERE RISKS TO THIS MANEUVER? In some cases, you may have other symptoms (such as changes in your vision, weakness, or numbness). If you have these symptoms, stop doing the maneuver and call your health care provider. Even if doing these maneuvers relieves your vertigo, you may still have dizziness. Dizziness is the sensation of light-headedness but without the sensation of movement. Even though the Epley maneuver may relieve your vertigo, it is possible that your symptoms will return within 5 years. WHAT SHOULD I DO AFTER  THIS MANEUVER? After doing the Epley maneuver, you can return to your normal activities. Ask your doctor if there is anything you should do at home to prevent vertigo. This may include:  Sleeping with two or more pillows to keep your head elevated.  Not sleeping on the side of your affected ear.  Getting up slowly from bed.  Avoiding sudden movements during the day.  Avoiding extreme head movement, like looking up or bending over.  Wearing a cervical collar to prevent sudden head movements. WHAT SHOULD I DO IF MY SYMPTOMS GET WORSE? Call your health care provider if your vertigo gets worse. Call your provider right way if you have other symptoms, including:   Nausea.  Vomiting.  Headache.  Weakness.  Numbness.  Vision changes.   This information is not intended to replace advice  given to you by your health care provider. Make sure you discuss any questions you have with your health care provider.   Document Released: 09/28/2013 Document Reviewed: 09/28/2013 Elsevier Interactive Patient Education 2016 Elsevier Inc.   Benign Positional Vertigo Vertigo is the feeling that you or your surroundings are moving when they are not. Benign positional vertigo is the most common form of vertigo. The cause of this condition is not serious (is benign). This condition is triggered by certain movements and positions (is positional). This condition can be dangerous if it occurs while you are doing something that could endanger you or others, such as driving.  CAUSES In many cases, the cause of this condition is not known. It may be caused by a disturbance in an area of the inner ear that helps your brain to sense movement and balance. This disturbance can be caused by a viral infection (labyrinthitis), head injury, or repetitive motion. RISK FACTORS This condition is more likely to develop in:  Women.  People who are 5 years of age or older. SYMPTOMS Symptoms of this condition usually  happen when you move your head or your eyes in different directions. Symptoms may start suddenly, and they usually last for less than a minute. Symptoms may include:  Loss of balance and falling.  Feeling like you are spinning or moving.  Feeling like your surroundings are spinning or moving.  Nausea and vomiting.  Blurred vision.  Dizziness.  Involuntary eye movement (nystagmus). Symptoms can be mild and cause only slight annoyance, or they can be severe and interfere with daily life. Episodes of benign positional vertigo may return (recur) over time, and they may be triggered by certain movements. Symptoms may improve over time. DIAGNOSIS This condition is usually diagnosed by medical history and a physical exam of the head, neck, and ears. You may be referred to a health care provider who specializes in ear, nose, and throat (ENT) problems (otolaryngologist) or a provider who specializes in disorders of the nervous system (neurologist). You may have additional testing, including:  MRI.  A CT scan.  Eye movement tests. Your health care provider may ask you to change positions quickly while he or she watches you for symptoms of benign positional vertigo, such as nystagmus. Eye movement may be tested with an electronystagmogram (ENG), caloric stimulation, the Dix-Hallpike test, or the roll test.  An electroencephalogram (EEG). This records electrical activity in your brain.  Hearing tests. TREATMENT Usually, your health care provider will treat this by moving your head in specific positions to adjust your inner ear back to normal. Surgery may be needed in severe cases, but this is rare. In some cases, benign positional vertigo may resolve on its own in 2-4 weeks. HOME CARE INSTRUCTIONS Safety  Move slowly.Avoid sudden body or head movements.  Avoid driving.  Avoid operating heavy machinery.  Avoid doing any tasks that would be dangerous to you or others if a vertigo episode  would occur.  If you have trouble walking or keeping your balance, try using a cane for stability. If you feel dizzy or unstable, sit down right away.  Return to your normal activities as told by your health care provider. Ask your health care provider what activities are safe for you. General Instructions  Take over-the-counter and prescription medicines only as told by your health care provider.  Avoid certain positions or movements as told by your health care provider.  Drink enough fluid to keep your urine clear or pale yellow.  Keep all follow-up visits as told by your health care provider. This is important. SEEK MEDICAL CARE IF:  You have a fever.  Your condition gets worse or you develop new symptoms.  Your family or friends notice any behavioral changes.  Your nausea or vomiting gets worse.  You have numbness or a "pins and needles" sensation. SEEK IMMEDIATE MEDICAL CARE IF:  You have difficulty speaking or moving.  You are always dizzy.  You faint.  You develop severe headaches.  You have weakness in your legs or arms.  You have changes in your hearing or vision.  You develop a stiff neck.  You develop sensitivity to light.   This information is not intended to replace advice given to you by your health care provider. Make sure you discuss any questions you have with your health care provider.   Document Released: 07/01/2006 Document Revised: 06/14/2015 Document Reviewed: 01/16/2015 Elsevier Interactive Patient Education [employer] Elsevier Inc.

## 2016-04-01 ENCOUNTER — Ambulatory Visit: Payer: BLUE CROSS/BLUE SHIELD

## 2016-08-26 ENCOUNTER — Ambulatory Visit (INDEPENDENT_AMBULATORY_CARE_PROVIDER_SITE_OTHER): Payer: BLUE CROSS/BLUE SHIELD | Admitting: *Deleted

## 2016-08-26 ENCOUNTER — Encounter: Payer: Self-pay | Admitting: *Deleted

## 2016-08-26 DIAGNOSIS — Z3490 Encounter for supervision of normal pregnancy, unspecified, unspecified trimester: Secondary | ICD-10-CM

## 2016-08-26 DIAGNOSIS — Z3201 Encounter for pregnancy test, result positive: Secondary | ICD-10-CM | POA: Diagnosis not present

## 2016-08-26 LAB — POCT PREGNANCY, URINE: PREG TEST UR: POSITIVE — AB

## 2016-08-26 NOTE — Progress Notes (Signed)
Here for pregnancy test- which was positive.Would like to start care here. Letter given. States LMP 07/19/16 which makes her 5 wk .States that was not a normal period for her- did not bleed as much or as long- only 3 days instead of 5-7 as usual. US ordered to verify dates.  Reviewed meds with her.

## 2016-09-04 ENCOUNTER — Ambulatory Visit (HOSPITAL_COMMUNITY): Payer: BLUE CROSS/BLUE SHIELD

## 2016-09-12 ENCOUNTER — Encounter (HOSPITAL_COMMUNITY): Payer: Self-pay | Admitting: Emergency Medicine

## 2016-09-12 ENCOUNTER — Ambulatory Visit (HOSPITAL_COMMUNITY)
Admission: EM | Admit: 2016-09-12 | Discharge: 2016-09-12 | Disposition: A | Discharge status: Home / Self Care | Payer: BLUE CROSS/BLUE SHIELD | Attending: Emergency Medicine | Admitting: Emergency Medicine

## 2016-09-12 DIAGNOSIS — B370 Candidal stomatitis: Secondary | ICD-10-CM

## 2016-09-12 MED ORDER — NYSTATIN 100000 UNIT/ML MT SUSP: 500000.0000 [IU] | Freq: Four times a day (QID) | OROMUCOSAL | 0 refills | Status: DC

## 2016-09-12 NOTE — Discharge Instructions (Signed)
Congratulations on your pregnancy. Use the nystatin 4 times a day for 1 week.   If this does not resolve this sour taste, it may just be pregnancy causing this sour taste. Follow-up as needed.

## 2016-09-12 NOTE — ED Triage Notes (Signed)
Pt has a history of thrush in pregnancy.  She noticed a sour taste in her mouth last night and it has not gone away.  She is concerned for thrush with this pregnancy.
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
## 2016-09-20 ENCOUNTER — Ambulatory Visit (HOSPITAL_COMMUNITY)
Admission: RE | Admit: 2016-09-20 | Discharge: 2016-09-20 | Disposition: A | Discharge status: Home / Self Care | Payer: BLUE CROSS/BLUE SHIELD | Source: Ambulatory Visit | Attending: Family Medicine | Admitting: Family Medicine

## 2016-09-20 ENCOUNTER — Other Ambulatory Visit: Payer: Self-pay | Admitting: Family Medicine

## 2016-09-20 DIAGNOSIS — Z3490 Encounter for supervision of normal pregnancy, unspecified, unspecified trimester: Secondary | ICD-10-CM

## 2016-09-20 DIAGNOSIS — Z3491 Encounter for supervision of normal pregnancy, unspecified, first trimester: Secondary | ICD-10-CM | POA: Insufficient documentation

## 2016-09-20 DIAGNOSIS — Z3A09 9 weeks gestation of pregnancy: Secondary | ICD-10-CM | POA: Insufficient documentation

## 2016-09-25 ENCOUNTER — Encounter: Payer: Self-pay | Admitting: Student

## 2016-09-25 ENCOUNTER — Ambulatory Visit (INDEPENDENT_AMBULATORY_CARE_PROVIDER_SITE_OTHER): Payer: BLUE CROSS/BLUE SHIELD | Admitting: Student

## 2016-09-25 DIAGNOSIS — Z113 Encounter for screening for infections with a predominantly sexual mode of transmission: Secondary | ICD-10-CM | POA: Diagnosis not present

## 2016-09-25 DIAGNOSIS — Z3480 Encounter for supervision of other normal pregnancy, unspecified trimester: Secondary | ICD-10-CM

## 2016-09-25 DIAGNOSIS — Z3481 Encounter for supervision of other normal pregnancy, first trimester: Secondary | ICD-10-CM

## 2016-09-25 DIAGNOSIS — Z3490 Encounter for supervision of normal pregnancy, unspecified, unspecified trimester: Secondary | ICD-10-CM | POA: Insufficient documentation

## 2016-09-25 NOTE — Patient Instructions (Signed)
First Trimester of Pregnancy  The first trimester of pregnancy is from week 1 until the end of week 12 (months 1 through 3). A week after a sperm fertilizes an egg, the egg will implant on the wall of the uterus. This embryo will begin to develop into a baby. Genes from you and your partner are forming the baby. The female genes determine whether the baby is a boy or a girl. At 6-8 weeks, the eyes and face are formed, and the heartbeat can be seen on ultrasound. At the end of 12 weeks, all the baby's organs are formed.   Now that you are pregnant, you will want to do everything you can to have a healthy baby. Two of the most important things are to get good prenatal care and to follow your health care provider's instructions. Prenatal care is all the medical care you receive before the baby's birth. This care will help prevent, find, and treat any problems during the pregnancy and childbirth.  BODY CHANGES  Your body goes through many changes during pregnancy. The changes vary from woman to woman.   · You may gain or lose a couple of pounds at first.  · You may feel sick to your stomach (nauseous) and throw up (vomit). If the vomiting is uncontrollable, call your health care provider.  · You may tire easily.  · You may develop headaches that can be relieved by medicines approved by your health care provider.  · You may urinate more often. Painful urination may mean you have a bladder infection.  · You may develop heartburn as a result of your pregnancy.  · You may develop constipation because certain hormones are causing the muscles that push waste through your intestines to slow down.  · You may develop hemorrhoids or swollen, bulging veins (varicose veins).  · Your breasts may begin to grow larger and become tender. Your nipples may stick out more, and the tissue that surrounds them (areola) may become darker.  · Your gums may bleed and may be sensitive to brushing and flossing.   · Dark spots or blotches (chloasma, mask of pregnancy) may develop on your face. This will likely fade after the baby is born.  · Your menstrual periods will stop.  · You may have a loss of appetite.  · You may develop cravings for certain kinds of food.  · You may have changes in your emotions from day to day, such as being excited to be pregnant or being concerned that something may go wrong with the pregnancy and baby.  · You may have more vivid and strange dreams.  · You may have changes in your hair. These can include thickening of your hair, rapid growth, and changes in texture. Some women also have hair loss during or after pregnancy, or hair that feels dry or thin. Your hair will most likely return to normal after your baby is born.  WHAT TO EXPECT AT YOUR PRENATAL VISITS  During a routine prenatal visit:  · You will be weighed to make sure you and the baby are growing normally.  · Your blood pressure will be taken.  · Your abdomen will be measured to track your baby's growth.  · The fetal heartbeat will be listened to starting around week 10 or 12 of your pregnancy.  · Test results from any previous visits will be discussed.  Your health care provider may ask you:  · How you are feeling.  · If you   are feeling the baby move.  · If you have had any abnormal symptoms, such as leaking fluid, bleeding, severe headaches, or abdominal cramping.  · If you are using any tobacco products, including cigarettes, chewing tobacco, and electronic cigarettes.  · If you have any questions.  Other tests that may be performed during your first trimester include:  · Blood tests to find your blood type and to check for the presence of any previous infections. They will also be used to check for low iron levels (anemia) and Rh antibodies. Later in the pregnancy, blood tests for diabetes will be done along with other tests if problems develop.  · Urine tests to check for infections, diabetes, or protein in the urine.   · An ultrasound to confirm the proper growth and development of the baby.  · An amniocentesis to check for possible genetic problems.  · Fetal screens for spina bifida and Down syndrome.  · You may need other tests to make sure you and the baby are doing well.  · HIV (human immunodeficiency virus) testing. Routine prenatal testing includes screening for HIV, unless you choose not to have this test.  HOME CARE INSTRUCTIONS   Medicines  · Follow your health care provider's instructions regarding medicine use. Specific medicines may be either safe or unsafe to take during pregnancy.  · Take your prenatal vitamins as directed.  · If you develop constipation, try taking a stool softener if your health care provider approves.  Diet  · Eat regular, well-balanced meals. Choose a variety of foods, such as meat or vegetable-based protein, fish, milk and low-fat dairy products, vegetables, fruits, and whole grain breads and cereals. Your health care provider will help you determine the amount of weight gain that is right for you.  · Avoid raw meat and uncooked cheese. These carry germs that can cause birth defects in the baby.  · Eating four or five small meals rather than three large meals a day may help relieve nausea and vomiting. If you start to feel nauseous, eating a few soda crackers can be helpful. Drinking liquids between meals instead of during meals also seems to help nausea and vomiting.  · If you develop constipation, eat more high-fiber foods, such as fresh vegetables or fruit and whole grains. Drink enough fluids to keep your urine clear or pale yellow.  Activity and Exercise  · Exercise only as directed by your health care provider. Exercising will help you:    Control your weight.    Stay in shape.    Be prepared for labor and delivery.  · Experiencing pain or cramping in the lower abdomen or low back is a good sign that you should stop exercising. Check with your health care provider  before continuing normal exercises.  · Try to avoid standing for long periods of time. Move your legs often if you must stand in one place for a long time.  · Avoid heavy lifting.  · Wear low-heeled shoes, and practice good posture.  · You may continue to have sex unless your health care provider directs you otherwise.  Relief of Pain or Discomfort  · Wear a good support bra for breast tenderness.    · Take warm sitz baths to soothe any pain or discomfort caused by hemorrhoids. Use hemorrhoid cream if your health care provider approves.    · Rest with your legs elevated if you have leg cramps or low back pain.  · If you develop varicose veins in your   legs, wear support hose. Elevate your feet for 15 minutes, 3-4 times a day. Limit salt in your diet.  Prenatal Care  · Schedule your prenatal visits by the twelfth week of pregnancy. They are usually scheduled monthly at first, then more often in the last 2 months before delivery.  · Write down your questions. Take them to your prenatal visits.  · Keep all your prenatal visits as directed by your health care provider.  Safety  · Wear your seat belt at all times when driving.  · Make a list of emergency phone numbers, including numbers for family, friends, the hospital, and police and fire departments.  General Tips  · Ask your health care provider for a referral to a local prenatal education class. Begin classes no later than at the beginning of month 6 of your pregnancy.  · Ask for help if you have counseling or nutritional needs during pregnancy. Your health care provider can offer advice or refer you to specialists for help with various needs.  · Do not use hot tubs, steam rooms, or saunas.  · Do not douche or use tampons or scented sanitary pads.  · Do not cross your legs for long periods of time.  · Avoid cat litter boxes and soil used by cats. These carry germs that can cause birth defects in the baby and possibly loss of the fetus by miscarriage or stillbirth.   · Avoid all smoking, herbs, alcohol, and medicines not prescribed by your health care provider. Chemicals in these affect the formation and growth of the baby.  · Do not use any tobacco products, including cigarettes, chewing tobacco, and electronic cigarettes. If you need help quitting, ask your health care provider. You may receive counseling support and other resources to help you quit.  · Schedule a dentist appointment. At home, brush your teeth with a soft toothbrush and be gentle when you floss.  SEEK MEDICAL CARE IF:   · You have dizziness.  · You have mild pelvic cramps, pelvic pressure, or nagging pain in the abdominal area.  · You have persistent nausea, vomiting, or diarrhea.  · You have a bad smelling vaginal discharge.  · You have pain with urination.  · You notice increased swelling in your face, hands, legs, or ankles.  SEEK IMMEDIATE MEDICAL CARE IF:   · You have a fever.  · You are leaking fluid from your vagina.  · You have spotting or bleeding from your vagina.  · You have severe abdominal cramping or pain.  · You have rapid weight gain or loss.  · You vomit blood or material that looks like coffee grounds.  · You are exposed to German measles and have never had them.  · You are exposed to fifth disease or chickenpox.  · You develop a severe headache.  · You have shortness of breath.  · You have any kind of trauma, such as from a fall or a car accident.     This information is not intended to replace advice given to you by your health care provider. Make sure you discuss any questions you have with your health care provider.     Document Released: 09/17/2001 Document Revised: 10/14/2014 Document Reviewed: 08/03/2013  Elsevier Interactive Patient Education ©2017 [employer]
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
## 2016-09-26 LAB — PRENATAL PROFILE (SOLSTAS)
Antibody Screen: NEGATIVE
BASOS PCT: 0 %
Basophils Absolute: 0 cells/uL (ref 0–200)
EOS ABS: 152 {cells}/uL (ref 15–500)
Eosinophils Relative: 2 %
HEMATOCRIT: 36.8 % (ref 35.0–45.0)
HEP B S AG: NEGATIVE
HIV: NONREACTIVE
Hemoglobin: 12.2 g/dL (ref 11.7–15.5)
LYMPHS ABS: 1900 {cells}/uL (ref 850–3900)
Lymphocytes Relative: 25 %
MCH: 32.2 pg (ref 27.0–33.0)
MCHC: 33.2 g/dL (ref 32.0–36.0)
MCV: 97.1 fL (ref 80.0–100.0)
MONO ABS: 532 {cells}/uL (ref 200–950)
MPV: 8.3 fL (ref 7.5–12.5)
Monocytes Relative: 7 %
NEUTROS ABS: 5016 {cells}/uL (ref 1500–7800)
Neutrophils Relative %: 66 %
PLATELETS: 344 10*3/uL (ref 140–400)
RBC: 3.79 MIL/uL — AB (ref 3.80–5.10)
RDW: 13.9 % (ref 11.0–15.0)
Rh Type: POSITIVE
Rubella: 11 Index — ABNORMAL HIGH (ref <0.90)
WBC: 7.6 10*3/uL (ref 3.8–10.8)

## 2016-09-26 LAB — PAIN MGMT, PROFILE 6 CONF W/O MM, U
6 Acetylmorphine: NEGATIVE ng/mL (ref <10)
AMPHETAMINES: NEGATIVE ng/mL (ref <500)
Alcohol Metabolites: NEGATIVE ng/mL (ref <500)
Barbiturates: NEGATIVE ng/mL (ref <300)
Benzodiazepines: NEGATIVE ng/mL (ref <100)
Cocaine Metabolite: NEGATIVE ng/mL (ref <150)
Creatinine: 44.8 mg/dL (ref >=20.0)
MARIJUANA METABOLITE: NEGATIVE ng/mL (ref <20)
Methadone Metabolite: NEGATIVE ng/mL (ref <100)
OPIATES: NEGATIVE ng/mL (ref <100)
OXIDANT: NEGATIVE ug/mL (ref <200)
Oxycodone: NEGATIVE ng/mL (ref <100)
PH: 7.24 (ref 4.5–9.0)
PLEASE NOTE: 0
Phencyclidine: NEGATIVE ng/mL (ref <25)

## 2016-09-26 LAB — CULTURE, OB URINE: Organism ID, Bacteria: NO GROWTH

## 2016-09-26 LAB — HEMOGLOBIN A1C
Hgb A1c MFr Bld: 4.9 % (ref <5.7)
Mean Plasma Glucose: 94 mg/dL

## 2016-09-26 LAB — GC/CHLAMYDIA PROBE AMP (~~LOC~~) NOT AT ARMC
CHLAMYDIA, DNA PROBE: NEGATIVE
NEISSERIA GONORRHEA: NEGATIVE

## 2016-10-04 ENCOUNTER — Telehealth: Payer: Self-pay | Admitting: General Practice

## 2016-10-04 NOTE — Telephone Encounter (Signed)
Patient called and left a message stating she has had a migraine all day and wants to know what she can take. Called patient back and she states she is getting a sinus infection/cold and has had a headache because of it but is feeling slightly better today. Reviewed approved cough/cold medications with patient as well as non pharmacologic relief options. Patient verbalized understanding to all and had no questions

## 2016-10-07 NOTE — L&D Delivery Note (Signed)
Obstetrical Delivery Note   Date of Delivery:   04/18/2017 Primary OB:   [facility] Gestational Age/EDD: 6231w4d (Dated by 9wk4d ultrasound) Antepartum complications: asthma, history of preterm delivery  Delivered By:   Farrel Connersolleen Dontavious Emily, CNM  Delivery Type:   spontaneous vaginal delivery  Procedure Details:   CTSP shortly after AROM with urge to push. Patient pushed effectively delivering a viable female infant in ROA with left nuchal hand. Good cry. Baby placed on mother's abdomen and dried. After delayed cord clamping, the cord was cut by FOB. Baby then placed skin to skin with mother. Placenta and 3 vessel cord delivered intact. Anesthesia:    epidural Intrapartum complications: None GBS:    negative Laceration:    none Episiotomy:    none Placenta:    Via active 3rd stage. To pathology: no Estimated Blood Loss:  350 ml  Baby:    Liveborn female, Apgars 9/9, weight pending    Farrel Connersolleen Myrissa Chipley, CNM

## 2016-10-23 ENCOUNTER — Encounter: Payer: BLUE CROSS/BLUE SHIELD | Admitting: Certified Nurse Midwife

## 2016-11-05 ENCOUNTER — Ambulatory Visit (INDEPENDENT_AMBULATORY_CARE_PROVIDER_SITE_OTHER): Payer: BLUE CROSS/BLUE SHIELD | Admitting: Certified Nurse Midwife

## 2016-11-05 VITALS — BP 109/70 | HR 87 | Wt 127.0 lb

## 2016-11-05 DIAGNOSIS — Z3482 Encounter for supervision of other normal pregnancy, second trimester: Secondary | ICD-10-CM

## 2016-11-05 DIAGNOSIS — O09219 Supervision of pregnancy with history of pre-term labor, unspecified trimester: Secondary | ICD-10-CM

## 2016-11-05 DIAGNOSIS — O09899 Supervision of other high risk pregnancies, unspecified trimester: Secondary | ICD-10-CM

## 2016-11-05 DIAGNOSIS — O09212 Supervision of pregnancy with history of pre-term labor, second trimester: Secondary | ICD-10-CM
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
## 2016-11-09 ENCOUNTER — Ambulatory Visit (HOSPITAL_COMMUNITY)
Admission: EM | Admit: 2016-11-09 | Discharge: 2016-11-09 | Disposition: A | Discharge status: Home / Self Care | Payer: BLUE CROSS/BLUE SHIELD | Attending: Emergency Medicine | Admitting: Emergency Medicine

## 2016-11-09 ENCOUNTER — Encounter (HOSPITAL_COMMUNITY): Payer: Self-pay | Admitting: *Deleted

## 2016-11-09 ENCOUNTER — Ambulatory Visit (HOSPITAL_COMMUNITY)
Admission: EM | Admit: 2016-11-09 | Discharge: 2016-11-09 | Discharge status: Against Medical Advice | Payer: BLUE CROSS/BLUE SHIELD

## 2016-11-09 DIAGNOSIS — Z833 Family history of diabetes mellitus: Secondary | ICD-10-CM | POA: Insufficient documentation

## 2016-11-09 DIAGNOSIS — Z8249 Family history of ischemic heart disease and other diseases of the circulatory system: Secondary | ICD-10-CM | POA: Insufficient documentation

## 2016-11-09 DIAGNOSIS — Z79899 Other long term (current) drug therapy: Secondary | ICD-10-CM | POA: Diagnosis not present

## 2016-11-09 DIAGNOSIS — M549 Dorsalgia, unspecified: Secondary | ICD-10-CM | POA: Diagnosis present

## 2016-11-09 DIAGNOSIS — R7611 Nonspecific reaction to tuberculin skin test without active tuberculosis: Secondary | ICD-10-CM | POA: Diagnosis not present

## 2016-11-09 DIAGNOSIS — R109 Unspecified abdominal pain: Secondary | ICD-10-CM | POA: Diagnosis not present

## 2016-11-09 DIAGNOSIS — J45909 Unspecified asthma, uncomplicated: Secondary | ICD-10-CM | POA: Diagnosis not present

## 2016-11-09 LAB — POCT URINALYSIS DIP (DEVICE)
Bilirubin Urine: NEGATIVE
Glucose, UA: NEGATIVE mg/dL
Hgb urine dipstick: NEGATIVE
KETONES UR: NEGATIVE mg/dL
Nitrite: NEGATIVE
PH: 7 (ref 5.0–8.0)
PROTEIN: NEGATIVE mg/dL
SPECIFIC GRAVITY, URINE: 1.025 (ref 1.005–1.030)
Urobilinogen, UA: 0.2 mg/dL (ref 0.0–1.0)

## 2016-11-09 NOTE — ED Provider Notes (Signed)
CSN: [id number]     Arrival date & time 11/09/16  1743 History   First MD Initiated Contact with Patient 11/09/16 1900     Chief Complaint  Patient presents with  . Back Pain   (Consider location/radiation/quality/duration/timing/severity/associated sxs/prior Treatment) 31 year old female presents to clinic with 4 day history of left sided flank pain. Denies dysuria, urgency, or frequency, denies abdominal pain, fever, or chills, She is [redacted] weeks pregnant, under the care of an OB/GYN. She denies traumatic cause of her pain.    The history is provided by the patient.  Back Pain    Past Medical History:  Diagnosis Date  . Asthma   . PPD positive, treated 2008   Past Surgical History:  Procedure Laterality Date  . FOOT SURGERY    . NO PAST SURGERIES     Family History  Problem Relation Age of Onset  . Hypertension Mother   . Diabetes Maternal Grandmother    Social History  Substance Use Topics  . Smoking status: Never Smoker  . Smokeless tobacco: Never Used  . Alcohol use No   OB History    Gravida Para Term Preterm AB Living   4 3 2 1  0 3   SAB TAB Ectopic Multiple Live Births   0 0 0 0 3     Review of Systems  Reason unable to perform ROS: as covere din HPI.  Musculoskeletal: Positive for back pain.  All other systems reviewed and are negative.   Allergies  Patient has no known allergies.  Home Medications   Prior to Admission medications   Medication Sig Start Date End Date Taking? Authorizing Provider  Prenatal Vit-Fe Fumarate-FA (PRENATAL MULTIVITAMIN) TABS tablet Take 1 tablet by mouth daily at 12 noon.    Historical Provider, MD   Meds Ordered and Administered this Visit  Medications - No data to display  BP 130/70 (BP Location: Right Arm)   Pulse 82   Temp 98.6 F (37 C) (Oral)   Resp 18   LMP 02/22/2016 (Approximate) Comment: currently taking depo-provera  SpO2 98%  No data found.   Physical Exam  Constitutional: She is oriented to person,  place, and time. She appears well-developed and well-nourished. No distress.  Cardiovascular: Normal rate and regular rhythm.   Pulmonary/Chest: Effort normal.  Abdominal: Soft. Bowel sounds are normal. She exhibits no distension and no mass. There is no tenderness. There is CVA tenderness (left sided CVA). There is no guarding, no tenderness at McBurney's point and negative Murphy's sign.  Neurological: She is alert and oriented to person, place, and time.  Skin: Skin is warm and dry. Capillary refill takes less than 2 seconds. She is not diaphoretic.  Psychiatric: She has a normal mood and affect.  Nursing note and vitals reviewed.   Urgent Care Course     Procedures (including critical care time)  Labs Review Labs Reviewed  POCT URINALYSIS DIP (DEVICE) - Abnormal; Notable for the following:       Result Value   Leukocytes, UA TRACE (*)    All other components within normal limits  URINE CULTURE    Imaging Review No results found.   Visual Acuity Review  Right Eye Distance:   Left Eye Distance:   Bilateral Distance:    Right Eye Near:   Left Eye Near:    Bilateral Near:         MDM   1. Flank pain   Your UA does not clearly indicate you  have an infection. Your urine will be sent for culture to confirm and if positive an antibiotic will be called in. Based on your symptoms,I am concerned for a kidney stone. If you are not experiencing relief in 4-5 days, I would follow up with your OB/GYN or primary care provider as kidney stones can cause kidney damage. You may take Tylenol for pain as needed, for further pain relief I would consult your OB/GYN.     Dorena Bodo, NP 11/09/16 1920

## 2016-11-09 NOTE — ED Triage Notes (Signed)
Pt is  Pregnant  She  Reports   l  Sided   Back  Pain   Radiating      Upwards      denys  Any  specefic  Injury  denys  Any  Urinary  Symptoms

## 2016-11-09 NOTE — Discharge Instructions (Signed)
Your UA does not clearly indicate you have an infection. Your urine will be sent for culture to confirm and if positive an antibiotic will be called in. Based on your symptoms,I am concerned for a kidney stone. If you are not experiencing relief in 4-5 days, I would follow up with your OB/GYN or primary care provider as kidney stones can cause kidney damage. You may take Tylenol for pain as needed, for further pain relief I would consult your OB/GYN.

## 2016-11-11 LAB — URINE CULTURE: Culture: 50000 — AB

## 2016-11-19 ENCOUNTER — Encounter (HOSPITAL_COMMUNITY): Payer: Self-pay | Admitting: Certified Nurse Midwife

## 2016-11-25 ENCOUNTER — Ambulatory Visit (HOSPITAL_COMMUNITY): Payer: BLUE CROSS/BLUE SHIELD

## 2016-11-25 ENCOUNTER — Other Ambulatory Visit (HOSPITAL_COMMUNITY): Payer: Self-pay | Admitting: Maternal and Fetal Medicine

## 2016-11-25 ENCOUNTER — Ambulatory Visit (HOSPITAL_COMMUNITY)
Admission: RE | Admit: 2016-11-25 | Discharge: 2016-11-25 | Disposition: A | Discharge status: Home / Self Care | Payer: BLUE CROSS/BLUE SHIELD | Source: Ambulatory Visit | Attending: Certified Nurse Midwife | Admitting: Certified Nurse Midwife

## 2016-11-25 DIAGNOSIS — Z3482 Encounter for supervision of other normal pregnancy, second trimester: Secondary | ICD-10-CM

## 2016-11-25 DIAGNOSIS — O09212 Supervision of pregnancy with history of pre-term labor, second trimester: Secondary | ICD-10-CM | POA: Diagnosis not present

## 2016-11-25 DIAGNOSIS — Z3A19 19 weeks gestation of pregnancy: Secondary | ICD-10-CM | POA: Insufficient documentation

## 2016-11-25 DIAGNOSIS — Z3689 Encounter for other specified antenatal screening: Secondary | ICD-10-CM | POA: Insufficient documentation

## 2016-12-03 ENCOUNTER — Ambulatory Visit (INDEPENDENT_AMBULATORY_CARE_PROVIDER_SITE_OTHER): Payer: BLUE CROSS/BLUE SHIELD | Admitting: Student

## 2016-12-03 VITALS — BP 112/57 | HR 92 | Wt 130.4 lb

## 2016-12-03 DIAGNOSIS — O26892 Other specified pregnancy related conditions, second trimester: Secondary | ICD-10-CM

## 2016-12-03 DIAGNOSIS — M545 Low back pain, unspecified: Secondary | ICD-10-CM

## 2016-12-03 DIAGNOSIS — O26891 Other specified pregnancy related conditions, first trimester: Secondary | ICD-10-CM

## 2016-12-03 DIAGNOSIS — Z3482 Encounter for supervision of other normal pregnancy, second trimester: Secondary | ICD-10-CM

## 2016-12-03 MED ORDER — COMFORT FIT MATERNITY SUPP SM MISC: 1.0000 [IU] | Freq: Every day | 0 refills | Status: DC | PRN

## 2016-12-03 NOTE — Progress Notes (Signed)
Had some dizziness at work on 12/02/16.

## 2016-12-03 NOTE — Patient Instructions (Signed)
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
## 2016-12-04 ENCOUNTER — Telehealth: Payer: Self-pay | Admitting: *Deleted

## 2016-12-04 NOTE — Telephone Encounter (Signed)
Pt left message stating that she needs an adjustment to her LOA papers. She says she was only approved for 2 hrs for 3 days per month. She needs more time off and says she is stressed out at work. She also wants to be taken out of work sooner - on 4/13 if not earlier.

## 2016-12-05 NOTE — Telephone Encounter (Signed)
Patient called and left message stating she needs an adjusted to her LOA papers. Patient states the paper states she is only approved for 2 hours 3 days a month. Patient states she works 10 hour shifts at a time and needs this adjusted. Patient states she also wants to know when she will be taken out of work. Patient is requesting the 13th of April if not sooner

## 2016-12-10 NOTE — Telephone Encounter (Signed)
Called and spoke w/pt regarding her previously stated requests for changes to her FMLA. I advised that she does not have a medical reason to need additional time off from work. The current FMLA is to prevent her from getting occurrences on her work attendance when coming for scheduled prenatal appointments. It was not intended to excuse her from entire days off from work (which is what pt is requesting). Pt stated that she sometimes does not feel well and needs more time off. I offered that she may be scheduled to come in sooner than 3/27 in order to discuss her request/concern with a provider. Pt voiced understanding of information given and stated that she would like a sooner appt. Message can be left on her voice mail. She will be contacted by one of our scheduling staff.

## 2016-12-11 ENCOUNTER — Ambulatory Visit (INDEPENDENT_AMBULATORY_CARE_PROVIDER_SITE_OTHER): Payer: BLUE CROSS/BLUE SHIELD | Admitting: Advanced Practice Midwife

## 2016-12-11 VITALS — BP 116/69 | HR 97 | Wt 133.7 lb

## 2016-12-11 DIAGNOSIS — O9989 Other specified diseases and conditions complicating pregnancy, childbirth and the puerperium: Principal | ICD-10-CM

## 2016-12-11 DIAGNOSIS — O26892 Other specified pregnancy related conditions, second trimester: Secondary | ICD-10-CM

## 2016-12-11 DIAGNOSIS — M549 Dorsalgia, unspecified: Secondary | ICD-10-CM

## 2016-12-11 MED ORDER — CYCLOBENZAPRINE HCL 10 MG PO TABS: 5.0000 mg | ORAL_TABLET | Freq: Three times a day (TID) | ORAL | 1 refills | Status: DC | PRN
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
## 2016-12-26 ENCOUNTER — Other Ambulatory Visit (HOSPITAL_COMMUNITY)
Admission: RE | Admit: 2016-12-26 | Discharge: 2016-12-26 | Disposition: A | Discharge status: Home / Self Care | Payer: BLUE CROSS/BLUE SHIELD | Source: Ambulatory Visit | Attending: Family Medicine | Admitting: Family Medicine

## 2016-12-26 ENCOUNTER — Encounter: Payer: Self-pay | Admitting: *Deleted

## 2016-12-26 ENCOUNTER — Ambulatory Visit: Payer: BLUE CROSS/BLUE SHIELD | Admitting: *Deleted

## 2016-12-26 VITALS — BP 119/77 | HR 100

## 2016-12-26 DIAGNOSIS — B3731 Acute candidiasis of vulva and vagina: Secondary | ICD-10-CM

## 2016-12-26 DIAGNOSIS — N898 Other specified noninflammatory disorders of vagina: Secondary | ICD-10-CM | POA: Diagnosis not present

## 2016-12-26 DIAGNOSIS — O26899 Other specified pregnancy related conditions, unspecified trimester: Secondary | ICD-10-CM | POA: Diagnosis not present

## 2016-12-26 DIAGNOSIS — B373 Candidiasis of vulva and vagina: Secondary | ICD-10-CM | POA: Insufficient documentation

## 2016-12-26 NOTE — Progress Notes (Signed)
Here with c/o yellowish vaginal discharge that sometimes causes itching. Will do wet prep. Requested another letter with her restrictions  For work per work request. Letter given.

## 2016-12-27 LAB — CERVICOVAGINAL ANCILLARY ONLY
Bacterial vaginitis: NEGATIVE
CANDIDA VAGINITIS: POSITIVE — AB
CHLAMYDIA, DNA PROBE: NEGATIVE
Neisseria Gonorrhea: NEGATIVE
Trichomonas: NEGATIVE

## 2016-12-30 MED ORDER — FLUCONAZOLE 150 MG PO TABS: 150.0000 mg | ORAL_TABLET | Freq: Once | ORAL | 3 refills | Status: AC

## 2016-12-30 NOTE — Addendum Note (Signed)
Addended by: Jaynie CollinsANYANWU, Rajanae Mantia A on: 12/30/2016 11:30 AM   Modules accepted: Orders

## 2016-12-31 ENCOUNTER — Ambulatory Visit (INDEPENDENT_AMBULATORY_CARE_PROVIDER_SITE_OTHER): Payer: BLUE CROSS/BLUE SHIELD | Admitting: Advanced Practice Midwife

## 2016-12-31 VITALS — BP 110/66 | HR 86 | Wt 135.1 lb

## 2016-12-31 DIAGNOSIS — O26899 Other specified pregnancy related conditions, unspecified trimester: Secondary | ICD-10-CM

## 2016-12-31 DIAGNOSIS — N898 Other specified noninflammatory disorders of vagina: Secondary | ICD-10-CM

## 2016-12-31 DIAGNOSIS — O26892 Other specified pregnancy related conditions, second trimester: Secondary | ICD-10-CM

## 2016-12-31 DIAGNOSIS — Z3482 Encounter for supervision of other normal pregnancy, second trimester: Secondary | ICD-10-CM
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
## 2017-01-09 ENCOUNTER — Encounter: Payer: Self-pay | Admitting: *Deleted

## 2017-01-09 ENCOUNTER — Observation Stay
Admission: EM | Admit: 2017-01-09 | Discharge: 2017-01-09 | Disposition: A | Discharge status: Home / Self Care | Payer: BLUE CROSS/BLUE SHIELD | Attending: Obstetrics and Gynecology | Admitting: Obstetrics and Gynecology

## 2017-01-09 DIAGNOSIS — Z3482 Encounter for supervision of other normal pregnancy, second trimester: Secondary | ICD-10-CM | POA: Diagnosis present
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
## 2017-01-09 NOTE — Discharge Instructions (Signed)
Get plenty of rest and stay well hydrated!  Continue to go to scheduled appts.

## 2017-01-16 NOTE — Discharge Summary (Signed)
Triage visit for NST   Brittney Ashley is a 31 y.o. [id number]. She is at [redacted]w[redacted]d gestation. She presents for a scheduled NST.  Indication: contractions  S: Resting comfortably. Occasional CTX, no VB. Active fetal movement. O:  BP 120/74 (BP Location: Left Arm)   Pulse 77   Temp 98.2 F (36.8 C) (Oral)   Resp 16   LMP 02/22/2016 (Approximate) Comment: currently taking depo-provera No results found for this or any previous visit (from the past 48 hour(s)).   Gen: NAD, AAOx3      Abd: FNTTP      Ext: Non-tender, Nonedmeatous    NST  Baseline: 130 Variability: moderate Accelerations present x >2 Decelerations absent Time  Interpretation: reactive NST, category 1 tracing  A/P:  31 y.o. [id number] [redacted]w[redacted]d with occasional irritability.    Reactive NST, with moderate variability and accelerations, no decels  Fetal Wellbeing: Reassuring  D/c home stable, precautions reviewed, follow-up as scheduled.     ----- Christeen Douglas, MD MPH Attending Obstetrician and Gynecologist [facility], Department of OB/GYN [facility]

## 2017-01-23 ENCOUNTER — Encounter: Payer: Self-pay | Admitting: Emergency Medicine

## 2017-01-23 ENCOUNTER — Emergency Department
Admission: EM | Admit: 2017-01-23 | Discharge: 2017-01-23 | Disposition: A | Discharge status: Home / Self Care | Payer: BLUE CROSS/BLUE SHIELD | Attending: Emergency Medicine | Admitting: Emergency Medicine

## 2017-01-23 ENCOUNTER — Emergency Department: Payer: BLUE CROSS/BLUE SHIELD

## 2017-01-23 DIAGNOSIS — J45909 Unspecified asthma, uncomplicated: Secondary | ICD-10-CM | POA: Insufficient documentation

## 2017-01-23 DIAGNOSIS — R0789 Other chest pain: Secondary | ICD-10-CM | POA: Diagnosis not present

## 2017-01-23 DIAGNOSIS — Z3A27 27 weeks gestation of pregnancy: Secondary | ICD-10-CM | POA: Diagnosis not present

## 2017-01-23 DIAGNOSIS — O26892 Other specified pregnancy related conditions, second trimester: Secondary | ICD-10-CM | POA: Diagnosis not present

## 2017-01-23 LAB — CBC
HEMATOCRIT: 34.5 % — AB (ref 35.0–47.0)
Hemoglobin: 12.1 g/dL (ref 12.0–16.0)
MCH: 33.9 pg (ref 26.0–34.0)
MCHC: 35 g/dL (ref 32.0–36.0)
MCV: 96.8 fL (ref 80.0–100.0)
PLATELETS: 303 10*3/uL (ref 150–440)
RBC: 3.56 MIL/uL — ABNORMAL LOW (ref 3.80–5.20)
RDW: 13.1 % (ref 11.5–14.5)
WBC: 8.4 10*3/uL (ref 3.6–11.0)

## 2017-01-23 LAB — BASIC METABOLIC PANEL
ANION GAP: 7 (ref 5–15)
BUN: 14 mg/dL (ref 6–20)
CALCIUM: 9 mg/dL (ref 8.9–10.3)
CO2: 25 mmol/L (ref 22–32)
Chloride: 102 mmol/L (ref 101–111)
Creatinine, Ser: 0.48 mg/dL (ref 0.44–1.00)
GFR calc Af Amer: >60 mL/min (ref >60)
GLUCOSE: 74 mg/dL (ref 65–99)
POTASSIUM: 3.6 mmol/L (ref 3.5–5.1)
Sodium: 134 mmol/L — ABNORMAL LOW (ref 135–145)

## 2017-01-23 LAB — TROPONIN I

## 2017-01-23 MED ORDER — IOPAMIDOL (ISOVUE-370) INJECTION 76%
75.0000 mL | Freq: Once | INTRAVENOUS | Status: AC | PRN
  Administered 2017-01-23: 75 mL via INTRAVENOUS
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
## 2017-01-23 NOTE — Discharge Instructions (Signed)
You have been seen in the Emergency Department (ED) today for chest pain.  As we have discussed today?s test results are normal and your pain has resolved.    Please follow up with the recommended doctor as instructed above in these documents regarding today?s emergent visit and your recent symptoms to discuss further management.  Continue to take your regular medications including prenatal vitamins.  For pain, we recommend that you take two extra strength Tylenol (1000 mg total) every 6 hours as needed.  Return to the Emergency Department (ED) if you experience any further chest pain/pressure/tightness, difficulty breathing, or sudden sweating, or other symptoms that concern you.

## 2017-01-23 NOTE — ED Triage Notes (Addendum)
Patient ambulatory to triage with steady gait, without difficulty or distress noted; pt reports mid chest pressure "all day" accomp by dizziness and SHOB; denies hx of same; pt [redacted]wks pregnant with no complications, EDC 7/16 G4P3, pt at [facility] in [place]

## 2017-01-23 NOTE — ED Notes (Signed)
FHTs located right mid abdomen 144 and regular

## 2017-01-28 ENCOUNTER — Ambulatory Visit (INDEPENDENT_AMBULATORY_CARE_PROVIDER_SITE_OTHER): Payer: BLUE CROSS/BLUE SHIELD | Admitting: Obstetrics and Gynecology

## 2017-01-28 ENCOUNTER — Encounter: Payer: BLUE CROSS/BLUE SHIELD | Admitting: Obstetrics and Gynecology

## 2017-01-28 ENCOUNTER — Ambulatory Visit (INDEPENDENT_AMBULATORY_CARE_PROVIDER_SITE_OTHER): Payer: BLUE CROSS/BLUE SHIELD | Admitting: Clinical

## 2017-01-28 VITALS — BP 126/70 | HR 93 | Wt 141.0 lb

## 2017-01-28 DIAGNOSIS — F4323 Adjustment disorder with mixed anxiety and depressed mood: Secondary | ICD-10-CM | POA: Diagnosis not present

## 2017-01-28 DIAGNOSIS — Z23 Encounter for immunization: Secondary | ICD-10-CM | POA: Diagnosis not present

## 2017-01-28 DIAGNOSIS — Z3402 Encounter for supervision of normal first pregnancy, second trimester: Secondary | ICD-10-CM

## 2017-01-28 NOTE — Patient Instructions (Signed)
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
## 2017-01-28 NOTE — BH Specialist Note (Signed)
Integrated Behavioral Health Initial Visit  MRN: [id number] Name: Brittney Ashley   Session Start time: 9:50 Session End time: 10:20 Total time: 30 minutes  Type of Service: Integrated Behavioral Health- Individual/Family Interpretor:No. Interpretor Name and Language: n/a   Warm Hand Off Completed.       SUBJECTIVE: Brittney Ashley is a 31 y.o. female accompanied by patient. Patient was referred by Dr Genevie Ann for depression, anxiety. Patient reports the following symptoms/concerns: Pt states that her primary concern is feeling shaky, shortness of breath, chest pain, dizziness, and chills at work, with no known medical cause; also sleeplessness, lack of appetite, irritability during this pregnancy. Pt says when she feels overwhelmed at work, she copes by going into a quiet space and waiting until the feeling passes.  Duration of problem: This pregnancy only; Severity of problem: moderate  OBJECTIVE: Mood: Anxious and Affect: Appropriate; pressured speech today(pt attributes symptoms to lack of sleep) Risk of harm to self or others: No plan to harm self or others   LIFE CONTEXT: Family and Social: Lives with husband and 3 children School/Work: Works as Designer, industrial/product: - Life Changes: Current pregnancy  GOALS ADDRESSED: Patient will reduce symptoms of: anxiety and depression and increase knowledge and/or ability of: self-management skills and also: Increase healthy adjustment to current life circumstances   INTERVENTIONS: Mindfulness or Relaxation Training  Standardized Assessments completed: GAD-7 and PHQ 9  ASSESSMENT: Patient currently experiencing Adjustment disorder with mixed anxious and depressed mood. Patient may benefit from continued monitoring of pressured speech, psychoeducation and brief therapeutic interventions regarding coping with symptoms of anxiety and depression.  PLAN: 1. Follow up with behavioral health clinician on : Two weeks, or as needed  (Evaluate pressured speech at next visit) 2. Behavioral recommendations:  -Practice CALM relaxation breathing exercise daily, as needed -Try sleep app nightly for as long as remains helpful with sleep -Read educational material regarding coping with anxiety with panic attacks and depression 3. Referral(s): Integrated [employer] (In Clinic) 4. "From scale of 1-10, how likely are you to follow plan?": 7  Shilah Hefel C Hawkin Charo, LCSWA

## 2017-01-29 LAB — GLUCOSE TOLERANCE, 2 HOURS W/ 1HR
GLUCOSE, 1 HOUR: 74 mg/dL (ref 65–179)
GLUCOSE, 2 HOUR: 81 mg/dL (ref 65–152)
GLUCOSE, FASTING: 72 mg/dL (ref 65–91)

## 2017-01-29 LAB — CBC
Hematocrit: 33.7 % — ABNORMAL LOW (ref 34.0–46.6)
Hemoglobin: 11.1 g/dL (ref 11.1–15.9)
MCH: 32.7 pg (ref 26.6–33.0)
MCHC: 32.9 g/dL (ref 31.5–35.7)
MCV: 99 fL — AB (ref 79–97)
PLATELETS: 246 10*3/uL (ref 150–379)
RBC: 3.39 x10E6/uL — AB (ref 3.77–5.28)
RDW: 13.5 % (ref 12.3–15.4)
WBC: 9.5 10*3/uL (ref 3.4–10.8)

## 2017-01-29 LAB — HIV ANTIBODY (ROUTINE TESTING W REFLEX): HIV Screen 4th Generation wRfx: NONREACTIVE

## 2017-01-29 LAB — RPR: RPR Ser Ql: NONREACTIVE

## 2017-02-19 ENCOUNTER — Encounter (HOSPITAL_COMMUNITY): Payer: Self-pay

## 2017-02-19 ENCOUNTER — Inpatient Hospital Stay (HOSPITAL_COMMUNITY)
Admission: AD | Admit: 2017-02-19 | Discharge: 2017-02-19 | Disposition: A | Discharge status: Home / Self Care | Payer: BLUE CROSS/BLUE SHIELD | Source: Ambulatory Visit | Attending: Obstetrics and Gynecology | Admitting: Obstetrics and Gynecology

## 2017-02-19 DIAGNOSIS — R7611 Nonspecific reaction to tuberculin skin test without active tuberculosis: Secondary | ICD-10-CM | POA: Insufficient documentation

## 2017-02-19 DIAGNOSIS — Z3689 Encounter for other specified antenatal screening: Secondary | ICD-10-CM

## 2017-02-19 DIAGNOSIS — J45909 Unspecified asthma, uncomplicated: Secondary | ICD-10-CM | POA: Insufficient documentation

## 2017-02-19 DIAGNOSIS — O99513 Diseases of the respiratory system complicating pregnancy, third trimester: Secondary | ICD-10-CM | POA: Insufficient documentation

## 2017-02-19 DIAGNOSIS — O4703 False labor before 37 completed weeks of gestation, third trimester: Secondary | ICD-10-CM | POA: Diagnosis not present

## 2017-02-19 DIAGNOSIS — Z3A31 31 weeks gestation of pregnancy: Secondary | ICD-10-CM | POA: Diagnosis not present

## 2017-02-19 LAB — URINALYSIS, ROUTINE W REFLEX MICROSCOPIC
Bilirubin Urine: NEGATIVE
GLUCOSE, UA: NEGATIVE mg/dL
Hgb urine dipstick: NEGATIVE
KETONES UR: NEGATIVE mg/dL
LEUKOCYTES UA: NEGATIVE
NITRITE: NEGATIVE
PROTEIN: NEGATIVE mg/dL
Specific Gravity, Urine: 1.018 (ref 1.005–1.030)
pH: 7 (ref 5.0–8.0)

## 2017-02-19 LAB — WET PREP, GENITAL
CLUE CELLS WET PREP: NONE SEEN
Sperm: NONE SEEN
Trich, Wet Prep: NONE SEEN
Yeast Wet Prep HPF POC: NONE SEEN

## 2017-02-19 MED ORDER — NIFEDIPINE 10 MG PO CAPS
10.0000 mg | ORAL_CAPSULE | ORAL | Status: AC
  Administered 2017-02-19: 10 mg via ORAL
  Filled 2017-02-19: qty 1

## 2017-02-19 NOTE — MAU Note (Signed)
Been having contractions for the last 2 days.  Last few hrs have been every 3 min.  Hx of PTL, one PTD.  Denies bleeding or leaking.

## 2017-02-19 NOTE — MAU Provider Note (Signed)
History     CSN: [id number]  Arrival date and time: 02/19/17 1228   First Provider Initiated Contact with Patient 02/19/17 1305      Chief Complaint  Patient presents with  . Contractions   [id number] @31 .2 wks here with ctx. Ctx started 2 days ago and were irregular then. Yesterday ctx were q9 min at times then she was able to sleep during the night. Today ctx started early this am and are irregular however she had 2 that were 3 min apart on the way here. She denies VB, LOF, and vaginal discharge. Good FM. No urinary sx. She admits to poor hydration. She is also concerned that her activity with her job is causing the ctx. 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     OB History    Gravida Para Term Preterm AB Living   4 3 2 1  0 3   SAB TAB Ectopic Multiple Live Births   0 0 0 0 3      Past Medical History:  Diagnosis Date  . Asthma   . PPD positive, treated 2008    Past Surgical History:  Procedure Laterality Date  . FOOT SURGERY    . NO PAST SURGERIES      Family History  Problem Relation Age of Onset  . Hypertension Mother   . Diabetes Maternal Grandmother     Social History  Substance Use Topics  . Smoking status: Never Smoker  . Smokeless tobacco: Never Used  . Alcohol use No    Allergies: No Known Allergies  Prescriptions Prior to Admission  Medication Sig Dispense Refill Last Dose  . acetaminophen (TYLENOL) 325 MG tablet Take 325 mg by mouth every 6 (six) hours as needed. Pt does not remember dosage but took two pills   Taking  . Prenatal Vit-Fe Fumarate-FA (PRENATAL MULTIVITAMIN) TABS tablet Take 1 tablet by mouth daily at 12 noon.   Taking    Review of Systems  Constitutional: Negative for fever.  Gastrointestinal: Positive for abdominal pain.  Genitourinary: Negative for dysuria, hematuria, urgency, vaginal bleeding and vaginal discharge.   Physical Exam   Blood pressure 109/63, pulse (!) 105,  temperature 98.3 F (36.8 C), temperature source Oral, resp. rate 18, height 5\' 2"  (1.575 m), weight 64 kg (141 lb), last menstrual period 02/22/2016, SpO2 100 %.  Physical Exam  Nursing note and vitals reviewed. Constitutional: She is oriented to person, place, and time. She appears well-developed and well-nourished. No distress (appears comfortable).  HENT:  Head: Normocephalic and atraumatic.  Neck: Normal range of motion.  Cardiovascular: Normal rate.   Respiratory: Effort normal. No respiratory distress.  GI: Soft. She exhibits no distension. There is no tenderness.  gravid  Genitourinary:  Genitourinary Comments: External: no lesions or erythema Vagina: rugated, parous, thick white discharge SVE: closed/30  Musculoskeletal: Normal range of motion.  Neurological: She is alert and oriented to person, place, and time.  Skin: Skin is warm and dry.  Psychiatric: She has a normal mood and affect.   EFM: 135 bpm, mod variability, + accels, no decels Toco: irregular  Results for orders placed or performed during the hospital encounter of 02/19/17 (from the past 24 hour(s))  Urinalysis, Routine w reflex microscopic     Status: None   Collection Time: 02/19/17 12:38 PM  Result Value Ref Range   Color, Urine YELLOW YELLOW   APPearance CLEAR CLEAR   Specific Gravity, Urine 1.018 1.005 -  1.030   pH 7.0 5.0 - 8.0   Glucose, UA NEGATIVE NEGATIVE mg/dL   Hgb urine dipstick NEGATIVE NEGATIVE   Bilirubin Urine NEGATIVE NEGATIVE   Ketones, ur NEGATIVE NEGATIVE mg/dL   Protein, ur NEGATIVE NEGATIVE mg/dL   Nitrite NEGATIVE NEGATIVE   Leukocytes, UA NEGATIVE NEGATIVE  Wet prep, genital     Status: Abnormal   Collection Time: 02/19/17  1:15 PM  Result Value Ref Range   Yeast Wet Prep HPF POC NONE SEEN NONE SEEN   Trich, Wet Prep NONE SEEN NONE SEEN   Clue Cells Wet Prep HPF POC NONE SEEN NONE SEEN   WBC, Wet Prep HPF POC MODERATE (A) NONE SEEN   Sperm NONE SEEN    MAU Course   Procedures Po hydration Procardia  MDM Labs ordered and reviewed. No evidence of UTI, pelvic infection, or PTL. Ctx reduced after Procardia and po hydration. No cervical change. Stable for discharge home.    Assessment and Plan   1. [redacted] weeks gestation of pregnancy   2. Preterm uterine contractions in third trimester, antepartum   3. NST (non-stress test) reactive    Discharge home Rest today, OOW until tomorrow PTL precautions Follow up in office as scheduled next week  Allergies as of 02/19/2017   No Known Allergies     Medication List    TAKE these medications   acetaminophen 325 MG tablet Commonly known as:  TYLENOL Take 325 mg by mouth every 6 (six) hours as needed. Pt does not remember dosage but took two pills   prenatal multivitamin Tabs tablet Take 1 tablet by mouth daily at 12 noon.      Donette LarryMelanie Naba Sneed, CNM 02/19/2017, 1:18 PM

## 2017-02-19 NOTE — Discharge Instructions (Signed)
Abdominal Pain During Pregnancy °Belly (abdominal) pain is common during pregnancy. Most of the time, it is not a serious problem. Other times, it can be a sign that something is wrong with the pregnancy. Always tell your doctor if you have belly pain. °Follow these instructions at home: °Monitor your belly pain for any changes. The following actions may help you feel better: °· Do not have sex (intercourse) or put anything in your vagina until you feel better. °· Rest until your pain stops. °· Drink clear fluids if you feel sick to your stomach (nauseous). Do not eat solid food until you feel better. °· Only take medicine as told by your doctor. °· Keep all doctor visits as told. °Get help right away if: °· You are bleeding, leaking fluid, or pieces of tissue come out of your vagina. °· You have more pain or cramping. °· You keep throwing up (vomiting). °· You have pain when you pee (urinate) or have blood in your pee. °· You have a fever. °· You do not feel your baby moving as much. °· You feel very weak or feel like passing out. °· You have trouble breathing, with or without belly pain. °· You have a very bad headache and belly pain. °· You have fluid leaking from your vagina and belly pain. °· You keep having watery poop (diarrhea). °· Your belly pain does not go away after resting, or the pain gets worse. °This information is not intended to replace advice given to you by your health care provider. Make sure you discuss any questions you have with your health care provider. °Document Released: 09/11/2009 Document Revised: 05/01/2016 Document Reviewed: 04/22/2013 °Elsevier Interactive Patient Education © 2017 [employer] ° °

## 2017-02-20 LAB — GC/CHLAMYDIA PROBE AMP (~~LOC~~) NOT AT ARMC
Chlamydia: NEGATIVE
NEISSERIA GONORRHEA: NEGATIVE

## 2017-02-25 ENCOUNTER — Encounter: Payer: Self-pay | Admitting: *Deleted

## 2017-02-25 ENCOUNTER — Encounter: Payer: Self-pay | Admitting: Obstetrics and Gynecology

## 2017-02-25 ENCOUNTER — Ambulatory Visit (INDEPENDENT_AMBULATORY_CARE_PROVIDER_SITE_OTHER): Payer: BLUE CROSS/BLUE SHIELD | Admitting: Obstetrics and Gynecology

## 2017-02-25 VITALS — BP 119/81 | HR 101 | Wt 142.0 lb

## 2017-02-25 DIAGNOSIS — O09899 Supervision of other high risk pregnancies, unspecified trimester: Secondary | ICD-10-CM

## 2017-02-25 DIAGNOSIS — Z3482 Encounter for supervision of other normal pregnancy, second trimester: Secondary | ICD-10-CM

## 2017-02-25 DIAGNOSIS — O09219 Supervision of pregnancy with history of pre-term labor, unspecified trimester: Secondary | ICD-10-CM
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
## 2017-03-06 ENCOUNTER — Telehealth: Payer: Self-pay | Admitting: *Deleted

## 2017-03-06 NOTE — Telephone Encounter (Signed)
Pt called in stating she has increased back pain at work due to physical position she has to be in to complete work assignments. She has had no relief with pregnancy belt and direct supervisor is not adhearing to work note stating max shift times of 8hrs. She would like her FMLA to begin 03/24/17 which will be at [redacted]wks [place]. Advised pt she will need HR to send FMLA forms to complete with that date to begin and I will forward to attending for approval. Gave pt office fax number for forms to be sent to. Pt expressed understanding.

## 2017-03-12 ENCOUNTER — Ambulatory Visit (INDEPENDENT_AMBULATORY_CARE_PROVIDER_SITE_OTHER): Payer: BLUE CROSS/BLUE SHIELD | Admitting: Obstetrics and Gynecology

## 2017-03-12 VITALS — BP 136/81 | HR 91 | Wt 142.0 lb

## 2017-03-12 DIAGNOSIS — O09219 Supervision of pregnancy with history of pre-term labor, unspecified trimester: Principal | ICD-10-CM

## 2017-03-12 DIAGNOSIS — O09213 Supervision of pregnancy with history of pre-term labor, third trimester: Secondary | ICD-10-CM

## 2017-03-12 DIAGNOSIS — O09899 Supervision of other high risk pregnancies, unspecified trimester: Secondary | ICD-10-CM

## 2017-03-12 DIAGNOSIS — Z3483 Encounter for supervision of other normal pregnancy, third trimester: Secondary | ICD-10-CM
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
## 2017-03-13 ENCOUNTER — Encounter (INDEPENDENT_AMBULATORY_CARE_PROVIDER_SITE_OTHER): Payer: Self-pay | Admitting: *Deleted

## 2017-03-24 ENCOUNTER — Other Ambulatory Visit (HOSPITAL_COMMUNITY)
Admission: RE | Admit: 2017-03-24 | Discharge: 2017-03-24 | Disposition: A | Discharge status: Home / Self Care | Payer: BLUE CROSS/BLUE SHIELD | Source: Ambulatory Visit | Attending: Obstetrics and Gynecology | Admitting: Obstetrics and Gynecology

## 2017-03-24 ENCOUNTER — Ambulatory Visit (INDEPENDENT_AMBULATORY_CARE_PROVIDER_SITE_OTHER): Payer: BLUE CROSS/BLUE SHIELD | Admitting: Obstetrics and Gynecology

## 2017-03-24 VITALS — BP 125/79 | HR 95

## 2017-03-24 DIAGNOSIS — Z3A00 Weeks of gestation of pregnancy not specified: Secondary | ICD-10-CM | POA: Diagnosis not present

## 2017-03-24 DIAGNOSIS — Z3483 Encounter for supervision of other normal pregnancy, third trimester: Secondary | ICD-10-CM

## 2017-03-24 DIAGNOSIS — Z113 Encounter for screening for infections with a predominantly sexual mode of transmission: Secondary | ICD-10-CM | POA: Diagnosis not present
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
## 2017-03-25 LAB — OB RESULTS CONSOLE GC/CHLAMYDIA
Chlamydia: NEGATIVE
Gonorrhea: NEGATIVE

## 2017-03-25 LAB — OB RESULTS CONSOLE GBS: GBS: NEGATIVE

## 2017-03-26 LAB — STREP GP B NAA: STREP GROUP B AG: NEGATIVE

## 2017-03-27 ENCOUNTER — Encounter: Payer: Self-pay | Admitting: *Deleted

## 2017-03-27 DIAGNOSIS — O9989 Other specified diseases and conditions complicating pregnancy, childbirth and the puerperium: Principal | ICD-10-CM

## 2017-03-27 DIAGNOSIS — O99891 Other specified diseases and conditions complicating pregnancy: Secondary | ICD-10-CM | POA: Insufficient documentation

## 2017-03-27 DIAGNOSIS — M549 Dorsalgia, unspecified: Secondary | ICD-10-CM

## 2017-03-27 LAB — CERVICOVAGINAL ANCILLARY ONLY
CHLAMYDIA, DNA PROBE: NEGATIVE
NEISSERIA GONORRHEA: NEGATIVE
TRICH (WINDOWPATH): NEGATIVE

## 2017-04-03 ENCOUNTER — Observation Stay
Admission: EM | Admit: 2017-04-03 | Discharge: 2017-04-03 | Disposition: A | Discharge status: Home / Self Care | Payer: BLUE CROSS/BLUE SHIELD | Attending: Obstetrics & Gynecology | Admitting: Obstetrics & Gynecology

## 2017-04-03 DIAGNOSIS — O9989 Other specified diseases and conditions complicating pregnancy, childbirth and the puerperium: Secondary | ICD-10-CM | POA: Diagnosis not present

## 2017-04-03 DIAGNOSIS — R109 Unspecified abdominal pain: Secondary | ICD-10-CM | POA: Diagnosis not present

## 2017-04-03 DIAGNOSIS — Z3A37 37 weeks gestation of pregnancy: Secondary | ICD-10-CM | POA: Insufficient documentation

## 2017-04-03 DIAGNOSIS — O26899 Other specified pregnancy related conditions, unspecified trimester: Secondary | ICD-10-CM | POA: Diagnosis present

## 2017-04-03 MED ORDER — ONDANSETRON HCL 4 MG/2ML IJ SOLN: 4.0000 mg | Freq: Four times a day (QID) | INTRAMUSCULAR | Status: DC | PRN

## 2017-04-03 MED ORDER — ACETAMINOPHEN 325 MG PO TABS: 650.0000 mg | ORAL_TABLET | ORAL | Status: DC | PRN

## 2017-04-07 NOTE — Final Progress Note (Signed)
Physician Final Progress Note  Patient ID: Rogue BussingShakirah L Antos MRN: [id number] DOB/AGE: 32/05/1986 32 y.o.  Admit date: 04/03/2017 Admitting provider: Nadara Mustardobert P Vane Yapp, MD Discharge date: 04/03/2017   Admission Diagnoses: Abdominal pain, pregnancy, 37 weeks  Discharge Diagnoses:  Active Problems:   Abdominal pain affecting pregnancy   Consults: None  Significant Findings/ Diagnostic Studies: Patient presented for evaluation of labor.  Patient had cervical exam by RN and this was reported to me. I reviewed her vital signs and fetal tracing, both of which were reassuring.  Patient was discharge as she was not laboring.  Procedures: A NST procedure was performed with FHR monitoring and a normal baseline established, appropriate time of 20-40 minutes of evaluation, and accels >2 seen w 15x15 characteristics.  Results show a REACTIVE NST.   Discharge Condition: good  Disposition: 01-Home or Self Care  Diet: Regular diet  Discharge Activity: Activity as tolerated   Allergies as of 04/03/2017   No Known Allergies     Medication List    ASK your doctor about these medications   acetaminophen 325 MG tablet Commonly known as:  TYLENOL Take 325 mg by mouth every 6 (six) hours as needed. Pt does not remember dosage but took two pills   prenatal multivitamin Tabs tablet Take 1 tablet by mouth daily at 12 noon.        Total time spent taking care of this patient: TRIAGE  Signed: Letitia Libraobert Paul Tighe Gitto 04/07/2017, 11:01 AM

## 2017-04-08 ENCOUNTER — Ambulatory Visit (INDEPENDENT_AMBULATORY_CARE_PROVIDER_SITE_OTHER): Payer: BLUE CROSS/BLUE SHIELD | Admitting: Family Medicine

## 2017-04-08 VITALS — BP 114/82 | HR 88 | Wt 144.0 lb

## 2017-04-08 DIAGNOSIS — Z3483 Encounter for supervision of other normal pregnancy, third trimester: Secondary | ICD-10-CM
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
## 2017-04-08 NOTE — Patient Instructions (Signed)
 Third Trimester of Pregnancy The third trimester is from week 28 through week 40 (months 7 through 9). The third trimester is a time when the unborn baby (fetus) is growing rapidly. At the end of the ninth month, the fetus is about 20 inches in length and weighs 6-10 pounds. Body changes during your third trimester Your body will continue to go through many changes during pregnancy. The changes vary from woman to woman. During the third trimester:  Your weight will continue to increase. You can expect to gain 25-35 pounds (11-16 kg) by the end of the pregnancy.  You may begin to get stretch marks on your hips, abdomen, and breasts.  You may urinate more often because the fetus is moving lower into your pelvis and pressing on your bladder.  You may develop or continue to have heartburn. This is caused by increased hormones that slow down muscles in the digestive tract.  You may develop or continue to have constipation because increased hormones slow digestion and cause the muscles that push waste through your intestines to relax.  You may develop hemorrhoids. These are swollen veins (varicose veins) in the rectum that can itch or be painful.  You may develop swollen, bulging veins (varicose veins) in your legs.  You may have increased body aches in the pelvis, back, or thighs. This is due to weight gain and increased hormones that are relaxing your joints.  You may have changes in your hair. These can include thickening of your hair, rapid growth, and changes in texture. Some women also have hair loss during or after pregnancy, or hair that feels dry or thin. Your hair will most likely return to normal after your baby is born.  Your breasts will continue to grow and they will continue to become tender. A yellow fluid (colostrum) may leak from your breasts. This is the first milk you are producing for your baby.  Your belly button may stick out.  You may notice more swelling in your  hands, face, or ankles.  You may have increased tingling or numbness in your hands, arms, and legs. The skin on your belly may also feel numb.  You may feel short of breath because of your expanding uterus.  You may have more problems sleeping. This can be caused by the size of your belly, increased need to urinate, and an increase in your body's metabolism.  You may notice the fetus "dropping," or moving lower in your abdomen (lightening).  You may have increased vaginal discharge.  You may notice your joints feel loose and you may have pain around your pelvic bone.  What to expect at prenatal visits You will have prenatal exams every 2 weeks until week 36. Then you will have weekly prenatal exams. During a routine prenatal visit:  You will be weighed to make sure you and the baby are growing normally.  Your blood pressure will be taken.  Your abdomen will be measured to track your baby's growth.  The fetal heartbeat will be listened to.  Any test results from the previous visit will be discussed.  You may have a cervical check near your due date to see if your cervix has softened or thinned (effaced).  You will be tested for Group B streptococcus. This happens between 35 and 37 weeks.  Your health care provider may ask you:  What your birth plan is.  How you are feeling.  If you are feeling the baby move.  If you have   had any abnormal symptoms, such as leaking fluid, bleeding, severe headaches, or abdominal cramping.  If you are using any tobacco products, including cigarettes, chewing tobacco, and electronic cigarettes.  If you have any questions.  Other tests or screenings that may be performed during your third trimester include:  Blood tests that check for low iron levels (anemia).  Fetal testing to check the health, activity level, and growth of the fetus. Testing is done if you have certain medical conditions or if there are problems during the  pregnancy.  Nonstress test (NST). This test checks the health of your baby to make sure there are no signs of problems, such as the baby not getting enough oxygen. During this test, a belt is placed around your belly. The baby is made to move, and its heart rate is monitored during movement.  What is false labor? False labor is a condition in which you feel small, irregular tightenings of the muscles in the womb (contractions) that usually go away with rest, changing position, or drinking water. These are called Braxton Hicks contractions. Contractions may last for hours, days, or even weeks before true labor sets in. If contractions come at regular intervals, become more frequent, increase in intensity, or become painful, you should see your health care provider. What are the signs of labor?  Abdominal cramps.  Regular contractions that start at 10 minutes apart and become stronger and more frequent with time.  Contractions that start on the top of the uterus and spread down to the lower abdomen and back.  Increased pelvic pressure and dull back pain.  A watery or bloody mucus discharge that comes from the vagina.  Leaking of amniotic fluid. This is also known as your "water breaking." It could be a slow trickle or a gush. Let your health care provider know if it has a color or strange odor. If you have any of these signs, call your health care provider right away, even if it is before your due date. Follow these instructions at home: Medicines  Follow your health care provider's instructions regarding medicine use. Specific medicines may be either safe or unsafe to take during pregnancy.  Take a prenatal vitamin that contains at least 600 micrograms (mcg) of folic acid.  If you develop constipation, try taking a stool softener if your health care provider approves. Eating and drinking  Eat a balanced diet that includes fresh fruits and vegetables, whole grains, good sources of protein  such as meat, eggs, or tofu, and low-fat dairy. Your health care provider will help you determine the amount of weight gain that is right for you.  Avoid raw meat and uncooked cheese. These carry germs that can cause birth defects in the baby.  If you have low calcium intake from food, talk to your health care provider about whether you should take a daily calcium supplement.  Eat four or five small meals rather than three large meals a day.  Limit foods that are high in fat and processed sugars, such as fried and sweet foods.  To prevent constipation: ? Drink enough fluid to keep your urine clear or pale yellow. ? Eat foods that are high in fiber, such as fresh fruits and vegetables, whole grains, and beans. Activity  Exercise only as directed by your health care provider. Most women can continue their usual exercise routine during pregnancy. Try to exercise for 30 minutes at least 5 days a week. Stop exercising if you experience uterine contractions.  Avoid   heavy lifting.  Do not exercise in extreme heat or humidity, or at high altitudes.  Wear low-heel, comfortable shoes.  Practice good posture.  You may continue to have sex unless your health care provider tells you otherwise. Relieving pain and discomfort  Take frequent breaks and rest with your legs elevated if you have leg cramps or low back pain.  Take warm sitz baths to soothe any pain or discomfort caused by hemorrhoids. Use hemorrhoid cream if your health care provider approves.  Wear a good support bra to prevent discomfort from breast tenderness.  If you develop varicose veins: ? Wear support pantyhose or compression stockings as told by your healthcare provider. ? Elevate your feet for 15 minutes, 3-4 times a day. Prenatal care  Write down your questions. Take them to your prenatal visits.  Keep all your prenatal visits as told by your health care provider. This is important. Safety  Wear your seat belt at  all times when driving.  Make a list of emergency phone numbers, including numbers for family, friends, the hospital, and police and fire departments. General instructions  Avoid cat litter boxes and soil used by cats. These carry germs that can cause birth defects in the baby. If you have a cat, ask someone to clean the litter box for you.  Do not travel far distances unless it is absolutely necessary and only with the approval of your health care provider.  Do not use hot tubs, steam rooms, or saunas.  Do not drink alcohol.  Do not use any products that contain nicotine or tobacco, such as cigarettes and e-cigarettes. If you need help quitting, ask your health care provider.  Do not use any medicinal herbs or unprescribed drugs. These chemicals affect the formation and growth of the baby.  Do not douche or use tampons or scented sanitary pads.  Do not cross your legs for long periods of time.  To prepare for the arrival of your baby: ? Take prenatal classes to understand, practice, and ask questions about labor and delivery. ? Make a trial run to the hospital. ? Visit the hospital and tour the maternity area. ? Arrange for maternity or paternity leave through employers. ? Arrange for family and friends to take care of pets while you are in the hospital. ? Purchase a rear-facing car seat and make sure you know how to install it in your car. ? Pack your hospital bag. ? Prepare the baby's nursery. Make sure to remove all pillows and stuffed animals from the baby's crib to prevent suffocation.  Visit your dentist if you have not gone during your pregnancy. Use a soft toothbrush to brush your teeth and be gentle when you floss. Contact a health care provider if:  You are unsure if you are in labor or if your water has broken.  You become dizzy.  You have mild pelvic cramps, pelvic pressure, or nagging pain in your abdominal area.  You have lower back pain.  You have persistent  nausea, vomiting, or diarrhea.  You have an unusual or bad smelling vaginal discharge.  You have pain when you urinate. Get help right away if:  Your water breaks before 37 weeks.  You have regular contractions less than 5 minutes apart before 37 weeks.  You have a fever.  You are leaking fluid from your vagina.  You have spotting or bleeding from your vagina.  You have severe abdominal pain or cramping.  You have rapid weight loss or weight   gain.  You have shortness of breath with chest pain.  You notice sudden or extreme swelling of your face, hands, ankles, feet, or legs.  Your baby makes fewer than 10 movements in 2 hours.  You have severe headaches that do not go away when you take medicine.  You have vision changes. Summary  The third trimester is from week 28 through week 40, months 7 through 9. The third trimester is a time when the unborn baby (fetus) is growing rapidly.  During the third trimester, your discomfort may increase as you and your baby continue to gain weight. You may have abdominal, leg, and back pain, sleeping problems, and an increased need to urinate.  During the third trimester your breasts will keep growing and they will continue to become tender. A yellow fluid (colostrum) may leak from your breasts. This is the first milk you are producing for your baby.  False labor is a condition in which you feel small, irregular tightenings of the muscles in the womb (contractions) that eventually go away. These are called Braxton Hicks contractions. Contractions may last for hours, days, or even weeks before true labor sets in.  Signs of labor can include: abdominal cramps; regular contractions that start at 10 minutes apart and become stronger and more frequent with time; watery or bloody mucus discharge that comes from the vagina; increased pelvic pressure and dull back pain; and leaking of amniotic fluid. This information is not intended to replace advice  given to you by your health care provider. Make sure you discuss any questions you have with your health care provider. Document Released: 09/17/2001 Document Revised: 02/29/2016 Document Reviewed: 11/24/2012 [employer] Interactive Patient Education  2017 [employer]   Breastfeeding Deciding to breastfeed is one of the best choices you can make for you and your baby. A change in hormones during pregnancy causes your breast tissue to grow and increases the number and size of your milk ducts. These hormones also allow proteins, sugars, and fats from your blood supply to make breast milk in your milk-producing glands. Hormones prevent breast milk from being released before your baby is born as well as prompt milk flow after birth. Once breastfeeding has begun, thoughts of your baby, as well as his or her sucking or crying, can stimulate the release of milk from your milk-producing glands. Benefits of breastfeeding For Your Baby  Your first milk (colostrum) helps your baby's digestive system function better.  There are antibodies in your milk that help your baby fight off infections.  Your baby has a lower incidence of asthma, allergies, and sudden infant death syndrome.  The nutrients in breast milk are better for your baby than infant formulas and are designed uniquely for your baby's needs.  Breast milk improves your baby's brain development.  Your baby is less likely to develop other conditions, such as childhood obesity, asthma, or type 2 diabetes mellitus.  For You  Breastfeeding helps to create a very special bond between you and your baby.  Breastfeeding is convenient. Breast milk is always available at the correct temperature and costs nothing.  Breastfeeding helps to burn calories and helps you lose the weight gained during pregnancy.  Breastfeeding makes your uterus contract to its prepregnancy size faster and slows bleeding (lochia) after you give birth.  Breastfeeding helps  to lower your risk of developing type 2 diabetes mellitus, osteoporosis, and breast or ovarian cancer later in life.  Signs that your baby is hungry Early Signs of Hunger    Increased alertness or activity.  Stretching.  Movement of the head from side to side.  Movement of the head and opening of the mouth when the corner of the mouth or cheek is stroked (rooting).  Increased sucking sounds, smacking lips, cooing, sighing, or squeaking.  Hand-to-mouth movements.  Increased sucking of fingers or hands.  Late Signs of Hunger  Fussing.  Intermittent crying.  Extreme Signs of Hunger Signs of extreme hunger will require calming and consoling before your baby will be able to breastfeed successfully. Do not wait for the following signs of extreme hunger to occur before you initiate breastfeeding:  Restlessness.  A loud, strong cry.  Screaming.  Breastfeeding basics Breastfeeding Initiation  Find a comfortable place to sit or lie down, with your neck and back well supported.  Place a pillow or rolled up blanket under your baby to bring him or her to the level of your breast (if you are seated). Nursing pillows are specially designed to help support your arms and your baby while you breastfeed.  Make sure that your baby's abdomen is facing your abdomen.  Gently massage your breast. With your fingertips, massage from your chest wall toward your nipple in a circular motion. This encourages milk flow. You may need to continue this action during the feeding if your milk flows slowly.  Support your breast with 4 fingers underneath and your thumb above your nipple. Make sure your fingers are well away from your nipple and your baby's mouth.  Stroke your baby's lips gently with your finger or nipple.  When your baby's mouth is open wide enough, quickly bring your baby to your breast, placing your entire nipple and as much of the colored area around your nipple (areola) as possible into  your baby's mouth. ? More areola should be visible above your baby's upper lip than below the lower lip. ? Your baby's tongue should be between his or her lower gum and your breast.  Ensure that your baby's mouth is correctly positioned around your nipple (latched). Your baby's lips should create a seal on your breast and be turned out (everted).  It is common for your baby to suck about 2-3 minutes in order to start the flow of breast milk.  Latching Teaching your baby how to latch on to your breast properly is very important. An improper latch can cause nipple pain and decreased milk supply for you and poor weight gain in your baby. Also, if your baby is not latched onto your nipple properly, he or she may swallow some air during feeding. This can make your baby fussy. Burping your baby when you switch breasts during the feeding can help to get rid of the air. However, teaching your baby to latch on properly is still the best way to prevent fussiness from swallowing air while breastfeeding. Signs that your baby has successfully latched on to your nipple:  Silent tugging or silent sucking, without causing you pain.  Swallowing heard between every 3-4 sucks.  Muscle movement above and in front of his or her ears while sucking.  Signs that your baby has not successfully latched on to nipple:  Sucking sounds or smacking sounds from your baby while breastfeeding.  Nipple pain.  If you think your baby has not latched on correctly, slip your finger into the corner of your baby's mouth to break the suction and place it between your baby's gums. Attempt breastfeeding initiation again. Signs of Successful Breastfeeding Signs from your baby:  A   gradual decrease in the number of sucks or complete cessation of sucking.  Falling asleep.  Relaxation of his or her body.  Retention of a small amount of milk in his or her mouth.  Letting go of your breast by himself or herself.  Signs from  you:  Breasts that have increased in firmness, weight, and size 1-3 hours after feeding.  Breasts that are softer immediately after breastfeeding.  Increased milk volume, as well as a change in milk consistency and color by the fifth day of breastfeeding.  Nipples that are not sore, cracked, or bleeding.  Signs That Your Baby is Getting Enough Milk  Wetting at least 1-2 diapers during the first 24 hours after birth.  Wetting at least 5-6 diapers every 24 hours for the first week after birth. The urine should be clear or pale yellow by 5 days after birth.  Wetting 6-8 diapers every 24 hours as your baby continues to grow and develop.  At least 3 stools in a 24-hour period by age 5 days. The stool should be soft and yellow.  At least 3 stools in a 24-hour period by age 7 days. The stool should be seedy and yellow.  No loss of weight greater than 10% of birth weight during the first 3 days of age.  Average weight gain of 4-7 ounces (113-198 g) per week after age 4 days.  Consistent daily weight gain by age 5 days, without weight loss after the age of 2 weeks.  After a feeding, your baby may spit up a small amount. This is common. Breastfeeding frequency and duration Frequent feeding will help you make more milk and can prevent sore nipples and breast engorgement. Breastfeed when you feel the need to reduce the fullness of your breasts or when your baby shows signs of hunger. This is called "breastfeeding on demand." Avoid introducing a pacifier to your baby while you are working to establish breastfeeding (the first 4-6 weeks after your baby is born). After this time you may choose to use a pacifier. Research has shown that pacifier use during the first year of a baby's life decreases the risk of sudden infant death syndrome (SIDS). Allow your baby to feed on each breast as long as he or she wants. Breastfeed until your baby is finished feeding. When your baby unlatches or falls asleep  while feeding from the first breast, offer the second breast. Because newborns are often sleepy in the first few weeks of life, you may need to awaken your baby to get him or her to feed. Breastfeeding times will vary from baby to baby. However, the following rules can serve as a guide to help you ensure that your baby is properly fed:  Newborns (babies 4 weeks of age or younger) may breastfeed every 1-3 hours.  Newborns should not go longer than 3 hours during the day or 5 hours during the night without breastfeeding.  You should breastfeed your baby a minimum of 8 times in a 24-hour period until you begin to introduce solid foods to your baby at around 6 months of age.  Breast milk pumping Pumping and storing breast milk allows you to ensure that your baby is exclusively fed your breast milk, even at times when you are unable to breastfeed. This is especially important if you are going back to work while you are still breastfeeding or when you are not able to be present during feedings. Your lactation consultant can give you guidelines on how   long it is safe to store breast milk. A breast pump is a machine that allows you to pump milk from your breast into a sterile bottle. The pumped breast milk can then be stored in a refrigerator or freezer. Some breast pumps are operated by hand, while others use electricity. Ask your lactation consultant which type will work best for you. Breast pumps can be purchased, but some hospitals and breastfeeding support groups lease breast pumps on a monthly basis. A lactation consultant can teach you how to hand express breast milk, if you prefer not to use a pump. Caring for your breasts while you breastfeed Nipples can become dry, cracked, and sore while breastfeeding. The following recommendations can help keep your breasts moisturized and healthy:  Avoid using soap on your nipples.  Wear a supportive bra. Although not required, special nursing bras and tank  tops are designed to allow access to your breasts for breastfeeding without taking off your entire bra or top. Avoid wearing underwire-style bras or extremely tight bras.  Air dry your nipples for 3-4minutes after each feeding.  Use only cotton bra pads to absorb leaked breast milk. Leaking of breast milk between feedings is normal.  Use lanolin on your nipples after breastfeeding. Lanolin helps to maintain your skin's normal moisture barrier. If you use pure lanolin, you do not need to wash it off before feeding your baby again. Pure lanolin is not toxic to your baby. You may also hand express a few drops of breast milk and gently massage that milk into your nipples and allow the milk to air dry.  In the first few weeks after giving birth, some women experience extremely full breasts (engorgement). Engorgement can make your breasts feel heavy, warm, and tender to the touch. Engorgement peaks within 3-5 days after you give birth. The following recommendations can help ease engorgement:  Completely empty your breasts while breastfeeding or pumping. You may want to start by applying warm, moist heat (in the shower or with warm water-soaked hand towels) just before feeding or pumping. This increases circulation and helps the milk flow. If your baby does not completely empty your breasts while breastfeeding, pump any extra milk after he or she is finished.  Wear a snug bra (nursing or regular) or tank top for 1-2 days to signal your body to slightly decrease milk production.  Apply ice packs to your breasts, unless this is too uncomfortable for you.  Make sure that your baby is latched on and positioned properly while breastfeeding.  If engorgement persists after 48 hours of following these recommendations, contact your health care provider or a lactation consultant. Overall health care recommendations while breastfeeding  Eat healthy foods. Alternate between meals and snacks, eating 3 of each per  day. Because what you eat affects your breast milk, some of the foods may make your baby more irritable than usual. Avoid eating these foods if you are sure that they are negatively affecting your baby.  Drink milk, fruit juice, and water to satisfy your thirst (about 10 glasses a day).  Rest often, relax, and continue to take your prenatal vitamins to prevent fatigue, stress, and anemia.  Continue breast self-awareness checks.  Avoid chewing and smoking tobacco. Chemicals from cigarettes that pass into breast milk and exposure to secondhand smoke may harm your baby.  Avoid alcohol and drug use, including marijuana. Some medicines that may be harmful to your baby can pass through breast milk. It is important to ask your health care   provider before taking any medicine, including all over-the-counter and prescription medicine as well as vitamin and herbal supplements. It is possible to become pregnant while breastfeeding. If birth control is desired, ask your health care provider about options that will be safe for your baby. Contact a health care provider if:  You feel like you want to stop breastfeeding or have become frustrated with breastfeeding.  You have painful breasts or nipples.  Your nipples are cracked or bleeding.  Your breasts are red, tender, or warm.  You have a swollen area on either breast.  You have a fever or chills.  You have nausea or vomiting.  You have drainage other than breast milk from your nipples.  Your breasts do not become full before feedings by the fifth day after you give birth.  You feel sad and depressed.  Your baby is too sleepy to eat well.  Your baby is having trouble sleeping.  Your baby is wetting less than 3 diapers in a 24-hour period.  Your baby has less than 3 stools in a 24-hour period.  Your baby's skin or the white part of his or her eyes becomes yellow.  Your baby is not gaining weight by 5 days of age. Get help right away  if:  Your baby is overly tired (lethargic) and does not want to wake up and feed.  Your baby develops an unexplained fever. This information is not intended to replace advice given to you by your health care provider. Make sure you discuss any questions you have with your health care provider. Document Released: 09/23/2005 Document Revised: 03/06/2016 Document Reviewed: 03/17/2013 Elsevier Interactive Patient Education  2017 [employer]  

## 2017-04-10 ENCOUNTER — Inpatient Hospital Stay (HOSPITAL_COMMUNITY)
Admission: AD | Admit: 2017-04-10 | Discharge: 2017-04-10 | Disposition: A | Discharge status: Home / Self Care | Payer: BLUE CROSS/BLUE SHIELD | Source: Ambulatory Visit | Attending: Obstetrics and Gynecology | Admitting: Obstetrics and Gynecology

## 2017-04-10 ENCOUNTER — Encounter (HOSPITAL_COMMUNITY): Payer: Self-pay

## 2017-04-10 DIAGNOSIS — Z3A38 38 weeks gestation of pregnancy: Secondary | ICD-10-CM | POA: Diagnosis not present

## 2017-04-10 DIAGNOSIS — O479 False labor, unspecified: Secondary | ICD-10-CM

## 2017-04-10 NOTE — MAU Note (Signed)
I have communicated with Dr. Mosetta PuttFeng and reviewed vital signs:  Vitals:   04/10/17 1802 04/10/17 2056  BP: 114/67 (!) 103/59  Pulse: 87 91  Resp: 18 16  Temp: 98 F (36.7 C)     Vaginal exam:  Dilation: 3 Effacement (%): 70 Cervical Position: Middle Station: -2 Presentation: Vertex Exam by:: K. WEissRN,   Also reviewed contraction pattern and that non-stress test is reactive.  It has been documented that patient is contracting every irregularly and mild with  No cervical change over 1.5  hours not indicating active labor.  Patient denies any other complaints.  Based on this report provider has given order for discharge.  A discharge order and diagnosis entered by a provider.   Labor discharge instructions reviewed with patient.

## 2017-04-10 NOTE — Discharge Instructions (Signed)

## 2017-04-10 NOTE — MAU Note (Signed)
Pt C/O contractions & pelvic & back pressure since 1530.  Denies bleeding or LOF.  Decreased FM today.

## 2017-04-10 NOTE — Progress Notes (Signed)
G4P3 @ 38.[redacted] wksga. Present to triage for r/o labor. Ctx started at about 1500 and progressively getting stronger. 5-6/10 pain. Denies LOF or bleeding. +FM EFM applied.   SVE 3/50-60/-3 vertex

## 2017-04-14 ENCOUNTER — Inpatient Hospital Stay (HOSPITAL_COMMUNITY): Payer: BLUE CROSS/BLUE SHIELD

## 2017-04-14 ENCOUNTER — Inpatient Hospital Stay (HOSPITAL_COMMUNITY)
Admission: AD | Admit: 2017-04-14 | Discharge: 2017-04-14 | Disposition: A | Discharge status: Home / Self Care | Payer: BLUE CROSS/BLUE SHIELD | Source: Ambulatory Visit | Attending: Obstetrics & Gynecology | Admitting: Obstetrics & Gynecology

## 2017-04-14 ENCOUNTER — Encounter (HOSPITAL_COMMUNITY): Payer: Self-pay | Admitting: *Deleted

## 2017-04-14 DIAGNOSIS — J45909 Unspecified asthma, uncomplicated: Secondary | ICD-10-CM | POA: Diagnosis not present

## 2017-04-14 DIAGNOSIS — O368130 Decreased fetal movements, third trimester, not applicable or unspecified: Secondary | ICD-10-CM | POA: Diagnosis present

## 2017-04-14 DIAGNOSIS — O99513 Diseases of the respiratory system complicating pregnancy, third trimester: Secondary | ICD-10-CM | POA: Diagnosis not present

## 2017-04-14 DIAGNOSIS — O09219 Supervision of pregnancy with history of pre-term labor, unspecified trimester: Secondary | ICD-10-CM | POA: Diagnosis not present

## 2017-04-14 DIAGNOSIS — Z3A39 39 weeks gestation of pregnancy: Secondary | ICD-10-CM | POA: Insufficient documentation

## 2017-04-14 DIAGNOSIS — Z3689 Encounter for other specified antenatal screening: Secondary | ICD-10-CM

## 2017-04-14 DIAGNOSIS — O368190 Decreased fetal movements, unspecified trimester, not applicable or unspecified: Secondary | ICD-10-CM

## 2017-04-14 NOTE — MAU Note (Signed)
Pt here with decreased fetal movement since yesterday, not moving as much as normal. Having slight contractions. Denies any bleeding or leaking of fluid.

## 2017-04-14 NOTE — MAU Provider Note (Signed)
History     CSN: [id number]  Arrival date and time: 04/14/17 0600   First Provider Initiated Contact with Patient 04/14/17 0700      Chief Complaint  Patient presents with  . Decreased Fetal Movement   HPI Brittney Ashley is a 31 y.o. [phone] at [redacted]w[redacted]d who presents with decreased fetal movement. She states the baby is moving but less than normal and it feels "lighter." She reports irregular contractions that she rates a 1/10. She denies leaking or vaginal bleeding.   OB History    Gravida Para Term Preterm AB Living   4 3 2 1  0 3   SAB TAB Ectopic Multiple Live Births   0 0 0 0 3      Past Medical History:  Diagnosis Date  . Abnormal Pap smear of cervix 12/15/2013   Records requested from previous prenatal provider Pap negative 06/29/14 Return to routine screening per [facility] guidelines   . Asthma   . PPD positive, treated 2008    Past Surgical History:  Procedure Laterality Date  . FOOT SURGERY    . NO PAST SURGERIES      Family History  Problem Relation Age of Onset  . Hypertension Mother   . Diabetes Maternal Grandmother     Social History  Substance Use Topics  . Smoking status: Never Smoker  . Smokeless tobacco: Never Used  . Alcohol use No    Allergies: No Known Allergies  Prescriptions Prior to Admission  Medication Sig Dispense Refill Last Dose  . acetaminophen (TYLENOL) 325 MG tablet Take 325 mg by mouth every 6 (six) hours as needed. Pt does not remember dosage but took two pills   Taking  . Prenatal Vit-Fe Fumarate-FA (PRENATAL MULTIVITAMIN) TABS tablet Take 1 tablet by mouth daily at 12 noon.   Taking    Review of Systems  Constitutional: Negative.  Negative for chills and fever.  HENT: Negative.   Respiratory: Negative.  Negative for shortness of breath.   Cardiovascular: Negative.  Negative for chest pain.  Gastrointestinal: Positive for abdominal pain. Negative for constipation, diarrhea, nausea and vomiting.  Genitourinary: Negative.   Negative for dysuria, vaginal bleeding and vaginal discharge.  Neurological: Negative.  Negative for dizziness and headaches.  Psychiatric/Behavioral: Negative.    Physical Exam   Blood pressure 121/75, pulse 78, temperature 98.1 F (36.7 C), temperature source Oral, resp. rate 18, height 5\' 2"  (1.575 m), weight 145 lb (65.8 kg), last menstrual period 02/22/2016, SpO2 100 %.  Physical Exam  Nursing note and vitals reviewed. Constitutional: She appears well-developed and well-nourished.  HENT:  Head: Normocephalic and atraumatic.  Eyes: Conjunctivae are normal. No scleral icterus.  Cardiovascular: Normal rate, regular rhythm and normal heart sounds.   Respiratory: Effort normal and breath sounds normal. No respiratory distress.  GI: Soft. She exhibits no distension. There is no tenderness.  Neurological: She is alert.  Skin: Skin is warm and dry.  Psychiatric: She has a normal mood and affect. Her behavior is normal. Judgment and thought content normal.   Fetal Tracing:  Baseline: 120bpm Variability: moderate Accelerations: 15x15 Decelerations: none  Toco: irregular uc's  MAU Course  Procedures  MDM NST- reactive BPP- 8/8, AFI normal  Assessment and Plan   1. Decreased fetal movement   2. [redacted] weeks gestation of pregnancy   3. NST (non-stress test) reactive    -Discharge patient home in stable condition -Fetal kick counts reviewed -Labor precautions reviewed -[facility] -Encouraged to return here or to other Urgent Care/ED if she develops worsening of symptoms, increase in pain, fever, or other concerning symptoms.   Cleone SlimCaroline Neill SNM 04/14/2017, 8:02 AM   I confirm that I have verified the information documented in the nurse midwife student's note and that I have also personally reperformed the physical exam and all medical decision making activities.   Thressa ShellerHeather Treanna Dumler 8:19 PM 04/14/17

## 2017-04-14 NOTE — Discharge Instructions (Signed)
Fetal Movement Counts °Patient Name: ________________________________________________ Patient Due Date: ____________________ °What is a fetal movement count? °A fetal movement count is the number of times that you feel your baby move during a certain amount of time. This may also be called a fetal kick count. A fetal movement count is recommended for every pregnant woman. You may be asked to start counting fetal movements as early as week 28 of your pregnancy. °Pay attention to when your baby is most active. You may notice your baby's sleep and wake cycles. You may also notice things that make your baby move more. You should do a fetal movement count: °· When your baby is normally most active. °· At the same time each day. ° °A good time to count movements is while you are resting, after having something to eat and drink. °How do I count fetal movements? °1. Find a quiet, comfortable area. Sit, or lie down on your side. °2. Write down the date, the start time and stop time, and the number of movements that you felt between those two times. Take this information with you to your health care visits. °3. For 2 hours, count kicks, flutters, swishes, rolls, and jabs. You should feel at least 10 movements during 2 hours. °4. You may stop counting after you have felt 10 movements. °5. If you do not feel 10 movements in 2 hours, have something to eat and drink. Then, keep resting and counting for 1 hour. If you feel at least 4 movements during that hour, you may stop counting. °Contact a health care provider if: °· You feel fewer than 4 movements in 2 hours. °· Your baby is not moving like he or she usually does. °Date: ____________ Start time: ____________ Stop time: ____________ Movements: ____________ °Date: ____________ Start time: ____________ Stop time: ____________ Movements: ____________ °Date: ____________ Start time: ____________ Stop time: ____________ Movements: ____________ °Date: ____________ Start time:  ____________ Stop time: ____________ Movements: ____________ °Date: ____________ Start time: ____________ Stop time: ____________ Movements: ____________ °Date: ____________ Start time: ____________ Stop time: ____________ Movements: ____________ °Date: ____________ Start time: ____________ Stop time: ____________ Movements: ____________ °Date: ____________ Start time: ____________ Stop time: ____________ Movements: ____________ °Date: ____________ Start time: ____________ Stop time: ____________ Movements: ____________ °This information is not intended to replace advice given to you by your health care provider. Make sure you discuss any questions you have with your health care provider. °Document Released: 10/23/2006 Document Revised: 05/22/2016 Document Reviewed: 11/02/2015 °[employer] Interactive Patient Education © 2018 [employer] °Braxton Hicks Contractions °Contractions of the uterus can occur throughout pregnancy, but they are not always a sign that you are in labor. You may have practice contractions called Braxton Hicks contractions. These false labor contractions are sometimes confused with true labor. °What are Braxton Hicks contractions? °Braxton Hicks contractions are tightening movements that occur in the muscles of the uterus before labor. Unlike true labor contractions, these contractions do not result in opening (dilation) and thinning of the cervix. Toward the end of pregnancy (32-34 weeks), Braxton Hicks contractions can happen more often and may become stronger. These contractions are sometimes difficult to tell apart from true labor because they can be very uncomfortable. You should not feel embarrassed if you go to the hospital with false labor. °Sometimes, the only way to tell if you are in true labor is for your health care provider to look for changes in the cervix. The health care provider will do a physical exam and may monitor your contractions. If   you are not in true labor, the exam  should show that your cervix is not dilating and your water has not broken. °If there are no prenatal problems or other health problems associated with your pregnancy, it is completely safe for you to be sent home with false labor. You may continue to have Braxton Hicks contractions until you go into true labor. °How can I tell the difference between true labor and false labor? °· Differences °? False labor °? Contractions last 30-70 seconds.: Contractions are usually shorter and not as strong as true labor contractions. °? Contractions become very regular.: Contractions are usually irregular. °? Discomfort is usually felt in the top of the uterus, and it spreads to the lower abdomen and low back.: Contractions are often felt in the front of the lower abdomen and in the groin. °? Contractions do not go away with walking.: Contractions may go away when you walk around or change positions while lying down. °? Contractions usually become more intense and increase in frequency.: Contractions get weaker and are shorter-lasting as time goes on. °? The cervix dilates and gets thinner.: The cervix usually does not dilate or become thin. °Follow these instructions at home: °· Take over-the-counter and prescription medicines only as told by your health care provider. °· Keep up with your usual exercises and follow other instructions from your health care provider. °· Eat and drink lightly if you think you are going into labor. °· If Braxton Hicks contractions are making you uncomfortable: °? Change your position from lying down or resting to walking, or change from walking to resting. °? Sit and rest in a tub of warm water. °? Drink enough fluid to keep your urine clear or pale yellow. Dehydration may cause these contractions. °? Do slow and deep breathing several times an hour. °· Keep all follow-up prenatal visits as told by your health care provider. This is important. °Contact a health care provider if: °· You have a  fever. °· You have continuous pain in your abdomen. °Get help right away if: °· Your contractions become stronger, more regular, and closer together. °· You have fluid leaking or gushing from your vagina. °· You pass blood-tinged mucus (bloody show). °· You have bleeding from your vagina. °· You have low back pain that you never had before. °· You feel your baby’s head pushing down and causing pelvic pressure. °· Your baby is not moving inside you as much as it used to. °Summary °· Contractions that occur before labor are called Braxton Hicks contractions, false labor, or practice contractions. °· Braxton Hicks contractions are usually shorter, weaker, farther apart, and less regular than true labor contractions. True labor contractions usually become progressively stronger and regular and they become more frequent. °· Manage discomfort from Braxton Hicks contractions by changing position, resting in a warm bath, drinking plenty of water, or practicing deep breathing. °This information is not intended to replace advice given to you by your health care provider. Make sure you discuss any questions you have with your health care provider. °Document Released: 09/23/2005 Document Revised: 08/12/2016 Document Reviewed: 08/12/2016 °[employer] Interactive Patient Education © 2017 [employer] °Third Trimester of Pregnancy °The third trimester is from week 28 through week 40 (months 7 through 9). The third trimester is a time when the unborn baby (fetus) is growing rapidly. At the end of the ninth month, the fetus is about 20 inches in length and weighs 6-10 pounds. °Body changes during your third trimester °Your body   will continue to go through many changes during pregnancy. The changes vary from woman to woman. During the third trimester: °· Your weight will continue to increase. You can expect to gain 25-35 pounds (11-16 kg) by the end of the pregnancy. °· You may begin to get stretch marks on your hips, abdomen, and  breasts. °· You may urinate more often because the fetus is moving lower into your pelvis and pressing on your bladder. °· You may develop or continue to have heartburn. This is caused by increased hormones that slow down muscles in the digestive tract. °· You may develop or continue to have constipation because increased hormones slow digestion and cause the muscles that push waste through your intestines to relax. °· You may develop hemorrhoids. These are swollen veins (varicose veins) in the rectum that can itch or be painful. °· You may develop swollen, bulging veins (varicose veins) in your legs. °· You may have increased body aches in the pelvis, back, or thighs. This is due to weight gain and increased hormones that are relaxing your joints. °· You may have changes in your hair. These can include thickening of your hair, rapid growth, and changes in texture. Some women also have hair loss during or after pregnancy, or hair that feels dry or thin. Your hair will most likely return to normal after your baby is born. °· Your breasts will continue to grow and they will continue to become tender. A yellow fluid (colostrum) may leak from your breasts. This is the first milk you are producing for your baby. °· Your belly button may stick out. °· You may notice more swelling in your hands, face, or ankles. °· You may have increased tingling or numbness in your hands, arms, and legs. The skin on your belly may also feel numb. °· You may feel short of breath because of your expanding uterus. °· You may have more problems sleeping. This can be caused by the size of your belly, increased need to urinate, and an increase in your body's metabolism. °· You may notice the fetus "dropping," or moving lower in your abdomen (lightening). °· You may have increased vaginal discharge. °· You may notice your joints feel loose and you may have pain around your pelvic bone. ° °What to expect at prenatal visits °You will have prenatal  exams every 2 weeks until week 36. Then you will have weekly prenatal exams. During a routine prenatal visit: °· You will be weighed to make sure you and the baby are growing normally. °· Your blood pressure will be taken. °· Your abdomen will be measured to track your baby's growth. °· The fetal heartbeat will be listened to. °· Any test results from the previous visit will be discussed. °· You may have a cervical check near your due date to see if your cervix has softened or thinned (effaced). °· You will be tested for Group B streptococcus. This happens between 35 and 37 weeks. ° °Your health care provider may ask you: °· What your birth plan is. °· How you are feeling. °· If you are feeling the baby move. °· If you have had any abnormal symptoms, such as leaking fluid, bleeding, severe headaches, or abdominal cramping. °· If you are using any tobacco products, including cigarettes, chewing tobacco, and electronic cigarettes. °· If you have any questions. ° °Other tests or screenings that may be performed during your third trimester include: °· Blood tests that check for low iron levels (anemia). °·   Fetal testing to check the health, activity level, and growth of the fetus. Testing is done if you have certain medical conditions or if there are problems during the pregnancy. °· Nonstress test (NST). This test checks the health of your baby to make sure there are no signs of problems, such as the baby not getting enough oxygen. During this test, a belt is placed around your belly. The baby is made to move, and its heart rate is monitored during movement. ° °What is false labor? °False labor is a condition in which you feel small, irregular tightenings of the muscles in the womb (contractions) that usually go away with rest, changing position, or drinking water. These are called Braxton Hicks contractions. Contractions may last for hours, days, or even weeks before true labor sets in. If contractions come at regular  intervals, become more frequent, increase in intensity, or become painful, you should see your health care provider. °What are the signs of labor? °· Abdominal cramps. °· Regular contractions that start at 10 minutes apart and become stronger and more frequent with time. °· Contractions that start on the top of the uterus and spread down to the lower abdomen and back. °· Increased pelvic pressure and dull back pain. °· A watery or bloody mucus discharge that comes from the vagina. °· Leaking of amniotic fluid. This is also known as your "water breaking." It could be a slow trickle or a gush. Let your health care provider know if it has a color or strange odor. °If you have any of these signs, call your health care provider right away, even if it is before your due date. °Follow these instructions at home: °Medicines °· Follow your health care provider's instructions regarding medicine use. Specific medicines may be either safe or unsafe to take during pregnancy. °· Take a prenatal vitamin that contains at least 600 micrograms (mcg) of folic acid. °· If you develop constipation, try taking a stool softener if your health care provider approves. °Eating and drinking °· Eat a balanced diet that includes fresh fruits and vegetables, whole grains, good sources of protein such as meat, eggs, or tofu, and low-fat dairy. Your health care provider will help you determine the amount of weight gain that is right for you. °· Avoid raw meat and uncooked cheese. These carry germs that can cause birth defects in the baby. °· If you have low calcium intake from food, talk to your health care provider about whether you should take a daily calcium supplement. °· Eat four or five small meals rather than three large meals a day. °· Limit foods that are high in fat and processed sugars, such as fried and sweet foods. °· To prevent constipation: °? Drink enough fluid to keep your urine clear or pale yellow. °? Eat foods that are high in  fiber, such as fresh fruits and vegetables, whole grains, and beans. °Activity °· Exercise only as directed by your health care provider. Most women can continue their usual exercise routine during pregnancy. Try to exercise for 30 minutes at least 5 days a week. Stop exercising if you experience uterine contractions. °· Avoid heavy lifting. °· Do not exercise in extreme heat or humidity, or at high altitudes. °· Wear low-heel, comfortable shoes. °· Practice good posture. °· You may continue to have sex unless your health care provider tells you otherwise. °Relieving pain and discomfort °· Take frequent breaks and rest with your legs elevated if you have leg cramps or low   back pain. °· Take warm sitz baths to soothe any pain or discomfort caused by hemorrhoids. Use hemorrhoid cream if your health care provider approves. °· Wear a good support bra to prevent discomfort from breast tenderness. °· If you develop varicose veins: °? Wear support pantyhose or compression stockings as told by your healthcare provider. °? Elevate your feet for 15 minutes, 3-4 times a day. °Prenatal care °· Write down your questions. Take them to your prenatal visits. °· Keep all your prenatal visits as told by your health care provider. This is important. °Safety °· Wear your seat belt at all times when driving. °· Make a list of emergency phone numbers, including numbers for family, friends, the hospital, and police and fire departments. °General instructions °· Avoid cat litter boxes and soil used by cats. These carry germs that can cause birth defects in the baby. If you have a cat, ask someone to clean the litter box for you. °· Do not travel far distances unless it is absolutely necessary and only with the approval of your health care provider. °· Do not use hot tubs, steam rooms, or saunas. °· Do not drink alcohol. °· Do not use any products that contain nicotine or tobacco, such as cigarettes and e-cigarettes. If you need help  quitting, ask your health care provider. °· Do not use any medicinal herbs or unprescribed drugs. These chemicals affect the formation and growth of the baby. °· Do not douche or use tampons or scented sanitary pads. °· Do not cross your legs for long periods of time. °· To prepare for the arrival of your baby: °? Take prenatal classes to understand, practice, and ask questions about labor and delivery. °? Make a trial run to the hospital. °? Visit the hospital and tour the maternity area. °? Arrange for maternity or paternity leave through employers. °? Arrange for family and friends to take care of pets while you are in the hospital. °? Purchase a rear-facing car seat and make sure you know how to install it in your car. °? Pack your hospital bag. °? Prepare the baby’s nursery. Make sure to remove all pillows and stuffed animals from the baby's crib to prevent suffocation. °· Visit your dentist if you have not gone during your pregnancy. Use a soft toothbrush to brush your teeth and be gentle when you floss. °Contact a health care provider if: °· You are unsure if you are in labor or if your water has broken. °· You become dizzy. °· You have mild pelvic cramps, pelvic pressure, or nagging pain in your abdominal area. °· You have lower back pain. °· You have persistent nausea, vomiting, or diarrhea. °· You have an unusual or bad smelling vaginal discharge. °· You have pain when you urinate. °Get help right away if: °· Your water breaks before 37 weeks. °· You have regular contractions less than 5 minutes apart before 37 weeks. °· You have a fever. °· You are leaking fluid from your vagina. °· You have spotting or bleeding from your vagina. °· You have severe abdominal pain or cramping. °· You have rapid weight loss or weight gain. °· You have shortness of breath with chest pain. °· You notice sudden or extreme swelling of your face, hands, ankles, feet, or legs. °· Your baby makes fewer than 10 movements in 2  hours. °· You have severe headaches that do not go away when you take medicine. °· You have vision changes. °Summary °· The third trimester   is from week 28 through week 40, months 7 through 9. The third trimester is a time when the unborn baby (fetus) is growing rapidly. °· During the third trimester, your discomfort may increase as you and your baby continue to gain weight. You may have abdominal, leg, and back pain, sleeping problems, and an increased need to urinate. °· During the third trimester your breasts will keep growing and they will continue to become tender. A yellow fluid (colostrum) may leak from your breasts. This is the first milk you are producing for your baby. °· False labor is a condition in which you feel small, irregular tightenings of the muscles in the womb (contractions) that eventually go away. These are called Braxton Hicks contractions. Contractions may last for hours, days, or even weeks before true labor sets in. °· Signs of labor can include: abdominal cramps; regular contractions that start at 10 minutes apart and become stronger and more frequent with time; watery or bloody mucus discharge that comes from the vagina; increased pelvic pressure and dull back pain; and leaking of amniotic fluid. °This information is not intended to replace advice given to you by your health care provider. Make sure you discuss any questions you have with your health care provider. °Document Released: 09/17/2001 Document Revised: 02/29/2016 Document Reviewed: 11/24/2012 °Elsevier Interactive Patient Education © 2017 [employer] ° °

## 2017-04-16 ENCOUNTER — Encounter (HOSPITAL_COMMUNITY): Payer: Self-pay | Admitting: *Deleted

## 2017-04-16 ENCOUNTER — Inpatient Hospital Stay (HOSPITAL_COMMUNITY)
Admission: AD | Admit: 2017-04-16 | Discharge: 2017-04-16 | Disposition: A | Discharge status: Home / Self Care | Payer: BLUE CROSS/BLUE SHIELD | Source: Ambulatory Visit | Attending: Obstetrics and Gynecology | Admitting: Obstetrics and Gynecology

## 2017-04-16 DIAGNOSIS — O9989 Other specified diseases and conditions complicating pregnancy, childbirth and the puerperium: Secondary | ICD-10-CM

## 2017-04-16 DIAGNOSIS — O471 False labor at or after 37 completed weeks of gestation: Secondary | ICD-10-CM | POA: Diagnosis not present

## 2017-04-16 DIAGNOSIS — Z3A38 38 weeks gestation of pregnancy: Secondary | ICD-10-CM | POA: Insufficient documentation

## 2017-04-16 DIAGNOSIS — M549 Dorsalgia, unspecified: Secondary | ICD-10-CM

## 2017-04-16 DIAGNOSIS — O99891 Other specified diseases and conditions complicating pregnancy: Secondary | ICD-10-CM

## 2017-04-16 MED ORDER — ZOLPIDEM TARTRATE 5 MG PO TABS: 5.0000 mg | ORAL_TABLET | Freq: Every evening | ORAL | Status: DC | PRN

## 2017-04-16 NOTE — MAU Note (Signed)
I have communicated with k. Clelia CroftShaw CNM and reviewed vital signs:  Vitals:   04/16/17 2028  BP: 120/73  Pulse: 97  Resp: 18  Temp: 98 F (36.7 C)    Vaginal exam:  Dilation: 3 Effacement (%): 80 Cervical Position: Middle Station: -2 Presentation: Vertex Exam by:: K. WEissRN,   Also reviewed contraction pattern and that non-stress test is reactive.  It has been documented that patient is contracting every 2-4 minutes with no cervical change over one hour not indicating active labor.  Patient denies any other complaints.  Based on this report provider has given order for discharge.  A discharge order and diagnosis entered by a provider.   Labor discharge instructions reviewed with patient.

## 2017-04-16 NOTE — Discharge Instructions (Signed)

## 2017-04-17 ENCOUNTER — Ambulatory Visit (INDEPENDENT_AMBULATORY_CARE_PROVIDER_SITE_OTHER): Payer: BLUE CROSS/BLUE SHIELD | Admitting: Obstetrics & Gynecology

## 2017-04-17 ENCOUNTER — Inpatient Hospital Stay (HOSPITAL_COMMUNITY)
Admission: AD | Admit: 2017-04-17 | Discharge: 2017-04-17 | Disposition: A | Discharge status: Home / Self Care | Payer: BLUE CROSS/BLUE SHIELD | Source: Ambulatory Visit | Attending: Obstetrics & Gynecology | Admitting: Obstetrics & Gynecology

## 2017-04-17 ENCOUNTER — Encounter (HOSPITAL_COMMUNITY): Payer: Self-pay

## 2017-04-17 VITALS — BP 114/66 | HR 85 | Wt 145.0 lb

## 2017-04-17 DIAGNOSIS — O471 False labor at or after 37 completed weeks of gestation: Secondary | ICD-10-CM | POA: Diagnosis not present

## 2017-04-17 DIAGNOSIS — Z3A39 39 weeks gestation of pregnancy: Secondary | ICD-10-CM | POA: Insufficient documentation

## 2017-04-17 DIAGNOSIS — Z3483 Encounter for supervision of other normal pregnancy, third trimester: Secondary | ICD-10-CM

## 2017-04-17 DIAGNOSIS — O479 False labor, unspecified: Secondary | ICD-10-CM

## 2017-04-17 DIAGNOSIS — Z3403 Encounter for supervision of normal first pregnancy, third trimester: Secondary | ICD-10-CM
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
## 2017-04-17 NOTE — MAU Note (Signed)
I have communicated with Cathie BeamsFran Cresenzo-Dishmon, CNM and reviewed vital signs:  Vitals:   04/17/17 2022  BP: 122/69  Pulse: (!) 112  Resp: 20  Temp: 98.8 F (37.1 C)    Vaginal exam:  Dilation: 3 Effacement (%): 80 Cervical Position: Anterior Station: -2 Presentation: Vertex Exam by:: Latricia HeftAnna Brette Cast, RN,   Also reviewed contraction pattern and that non-stress test is reactive.  It has been documented that patient is contracting every 5-6 minutes with no cervical change over 1.5 hours not indicating active labor.  Patient denies any other complaints.  Based on this report provider has given order for discharge.  A discharge order and diagnosis entered by a provider.   Labor discharge instructions reviewed with patient.

## 2017-04-17 NOTE — Discharge Instructions (Signed)

## 2017-04-17 NOTE — MAU Note (Signed)
CTX every 2-3 since 1830.  No LOF.  Increased mucousy discharge and some light bleeding from her membranes being stripped earlier.  Reports good fetal movement.

## 2017-04-17 NOTE — Patient Instructions (Signed)
Return to clinic for any scheduled appointments or obstetric concerns, or go to [facility] for evaluation  

## 2017-04-18 ENCOUNTER — Inpatient Hospital Stay: Payer: BLUE CROSS/BLUE SHIELD | Admitting: Anesthesiology

## 2017-04-18 ENCOUNTER — Inpatient Hospital Stay
Admission: EM | Admit: 2017-04-18 | Discharge: 2017-04-20 | DRG: 767 | Disposition: A | Discharge status: Home / Self Care | Payer: BLUE CROSS/BLUE SHIELD | Attending: Obstetrics & Gynecology | Admitting: Obstetrics & Gynecology

## 2017-04-18 DIAGNOSIS — R109 Unspecified abdominal pain: Secondary | ICD-10-CM

## 2017-04-18 DIAGNOSIS — O26899 Other specified pregnancy related conditions, unspecified trimester: Secondary | ICD-10-CM | POA: Diagnosis present

## 2017-04-18 DIAGNOSIS — O9081 Anemia of the puerperium: Secondary | ICD-10-CM | POA: Diagnosis not present

## 2017-04-18 DIAGNOSIS — J45909 Unspecified asthma, uncomplicated: Secondary | ICD-10-CM | POA: Diagnosis not present

## 2017-04-18 DIAGNOSIS — Z302 Encounter for sterilization: Secondary | ICD-10-CM | POA: Diagnosis not present

## 2017-04-18 DIAGNOSIS — Z3493 Encounter for supervision of normal pregnancy, unspecified, third trimester: Secondary | ICD-10-CM | POA: Diagnosis present

## 2017-04-18 DIAGNOSIS — D62 Acute posthemorrhagic anemia: Secondary | ICD-10-CM | POA: Diagnosis not present

## 2017-04-18 DIAGNOSIS — Z3A39 39 weeks gestation of pregnancy: Secondary | ICD-10-CM

## 2017-04-18 DIAGNOSIS — O9952 Diseases of the respiratory system complicating childbirth: Secondary | ICD-10-CM | POA: Diagnosis present

## 2017-04-18 LAB — TYPE AND SCREEN
ABO/RH(D): O POS
Antibody Screen: NEGATIVE

## 2017-04-18 LAB — CBC
HEMATOCRIT: 36.2 % (ref 35.0–47.0)
Hemoglobin: 12.7 g/dL (ref 12.0–16.0)
MCH: 33.1 pg (ref 26.0–34.0)
MCHC: 35.1 g/dL (ref 32.0–36.0)
MCV: 94.3 fL (ref 80.0–100.0)
PLATELETS: 250 10*3/uL (ref 150–440)
RBC: 3.84 MIL/uL (ref 3.80–5.20)
RDW: 14.4 % (ref 11.5–14.5)
WBC: 14 10*3/uL — AB (ref 3.6–11.0)

## 2017-04-18 MED ORDER — PHENYLEPHRINE 40 MCG/ML (10ML) SYRINGE FOR IV PUSH (FOR BLOOD PRESSURE SUPPORT): 80.0000 ug | PREFILLED_SYRINGE | INTRAVENOUS | Status: DC | PRN

## 2017-04-18 MED ORDER — COCONUT OIL OIL
1.0000 "application " | TOPICAL_OIL | Status: DC | PRN
  Administered 2017-04-19: 1 via TOPICAL
  Filled 2017-04-18: qty 120

## 2017-04-18 MED ORDER — LACTATED RINGERS IV SOLN: 500.0000 mL | INTRAVENOUS | Status: DC | PRN

## 2017-04-18 MED ORDER — LACTATED RINGERS IV SOLN
INTRAVENOUS | Status: DC
  Administered 2017-04-18: 03:00:00 via INTRAVENOUS
  Administered 2017-04-18: 1000 mL via INTRAVENOUS

## 2017-04-18 MED ORDER — WITCH HAZEL-GLYCERIN EX PADS: 1.0000 "application " | MEDICATED_PAD | CUTANEOUS | Status: DC | PRN

## 2017-04-18 MED ORDER — FENTANYL 2.5 MCG/ML W/ROPIVACAINE 0.15% IN NS 100 ML EPIDURAL (ARMC)
EPIDURAL | Status: AC
Start: 2017-04-18 — End: 2017-04-18
  Filled 2017-04-18: qty 100

## 2017-04-18 MED ORDER — ACETAMINOPHEN 325 MG PO TABS
650.0000 mg | ORAL_TABLET | ORAL | Status: DC | PRN
  Administered 2017-04-18: 650 mg via ORAL
  Filled 2017-04-18: qty 2

## 2017-04-18 MED ORDER — OXYTOCIN 10 UNIT/ML IJ SOLN
INTRAMUSCULAR | Status: AC
  Filled 2017-04-18: qty 2

## 2017-04-18 MED ORDER — LIDOCAINE-EPINEPHRINE (PF) 1.5 %-1:200000 IJ SOLN
INTRAMUSCULAR | Status: DC | PRN
  Administered 2017-04-18: 3 mL via PERINEURAL

## 2017-04-18 MED ORDER — PRENATAL MULTIVITAMIN CH
1.0000 | ORAL_TABLET | Freq: Every day | ORAL | Status: DC
  Administered 2017-04-18 – 2017-04-20 (×2): 1 via ORAL
  Filled 2017-04-18 (×3): qty 1

## 2017-04-18 MED ORDER — ONDANSETRON HCL 4 MG PO TABS: 4.0000 mg | ORAL_TABLET | ORAL | Status: DC | PRN

## 2017-04-18 MED ORDER — SIMETHICONE 80 MG PO CHEW
80.0000 mg | CHEWABLE_TABLET | ORAL | Status: DC | PRN
  Administered 2017-04-20: 80 mg via ORAL
  Filled 2017-04-18: qty 1

## 2017-04-18 MED ORDER — OXYTOCIN 40 UNITS IN LACTATED RINGERS INFUSION - SIMPLE MED
2.5000 [IU]/h | INTRAVENOUS | Status: DC
  Administered 2017-04-18: 2.5 [IU]/h via INTRAVENOUS
  Filled 2017-04-18: qty 1000

## 2017-04-18 MED ORDER — EPHEDRINE 5 MG/ML INJ: 10.0000 mg | INTRAVENOUS | Status: DC | PRN

## 2017-04-18 MED ORDER — ONDANSETRON HCL 4 MG/2ML IJ SOLN: 4.0000 mg | Freq: Four times a day (QID) | INTRAMUSCULAR | Status: DC | PRN

## 2017-04-18 MED ORDER — BUTORPHANOL TARTRATE 2 MG/ML IJ SOLN: 1.0000 mg | INTRAMUSCULAR | Status: DC | PRN

## 2017-04-18 MED ORDER — DIPHENHYDRAMINE HCL 50 MG/ML IJ SOLN: 12.5000 mg | INTRAMUSCULAR | Status: DC | PRN

## 2017-04-18 MED ORDER — OXYTOCIN BOLUS FROM INFUSION
500.0000 mL | Freq: Once | INTRAVENOUS | Status: DC
  Administered 2017-04-18: 500 mL via INTRAVENOUS

## 2017-04-18 MED ORDER — DIBUCAINE 1 % RE OINT: 1.0000 "application " | TOPICAL_OINTMENT | RECTAL | Status: DC | PRN

## 2017-04-18 MED ORDER — LIDOCAINE HCL (PF) 1 % IJ SOLN
INTRAMUSCULAR | Status: DC | PRN
  Administered 2017-04-18: 3 mL

## 2017-04-18 MED ORDER — CALCIUM CARBONATE ANTACID 500 MG PO CHEW
1.0000 | CHEWABLE_TABLET | Freq: Once | ORAL | Status: AC
  Administered 2017-04-18: 200 mg via ORAL

## 2017-04-18 MED ORDER — OXYCODONE HCL 5 MG PO TABS
5.0000 mg | ORAL_TABLET | ORAL | Status: DC | PRN
Start: 2017-04-18 — End: 2017-04-20
  Administered 2017-04-18 – 2017-04-19 (×3): 5 mg via ORAL
  Filled 2017-04-18 (×3): qty 1

## 2017-04-18 MED ORDER — MISOPROSTOL 200 MCG PO TABS
ORAL_TABLET | ORAL | Status: AC
  Filled 2017-04-18: qty 4

## 2017-04-18 MED ORDER — BUPIVACAINE HCL (PF) 0.25 % IJ SOLN
INTRAMUSCULAR | Status: DC | PRN
  Administered 2017-04-18: 10 mL via EPIDURAL

## 2017-04-18 MED ORDER — AMMONIA AROMATIC IN INHA
RESPIRATORY_TRACT | Status: AC
  Filled 2017-04-18: qty 10

## 2017-04-18 MED ORDER — LACTATED RINGERS IV SOLN: 500.0000 mL | Freq: Once | INTRAVENOUS | Status: DC

## 2017-04-18 MED ORDER — IBUPROFEN 600 MG PO TABS
600.0000 mg | ORAL_TABLET | Freq: Four times a day (QID) | ORAL | Status: DC
  Administered 2017-04-18 – 2017-04-19 (×5): 600 mg via ORAL
  Filled 2017-04-18 (×5): qty 1

## 2017-04-18 MED ORDER — BENZOCAINE-MENTHOL 20-0.5 % EX AERO: 1.0000 "application " | INHALATION_SPRAY | CUTANEOUS | Status: DC | PRN

## 2017-04-18 MED ORDER — FENTANYL 2.5 MCG/ML W/ROPIVACAINE 0.15% IN NS 100 ML EPIDURAL (ARMC)
12.0000 mL/h | EPIDURAL | Status: DC
  Administered 2017-04-18: 12 mL/h via EPIDURAL

## 2017-04-18 MED ORDER — ONDANSETRON HCL 4 MG/2ML IJ SOLN: 4.0000 mg | INTRAMUSCULAR | Status: DC | PRN

## 2017-04-18 MED ORDER — LIDOCAINE HCL (PF) 1 % IJ SOLN
INTRAMUSCULAR | Status: AC
  Filled 2017-04-18: qty 30

## 2017-04-18 MED ORDER — FERROUS SULFATE 325 (65 FE) MG PO TABS
325.0000 mg | ORAL_TABLET | Freq: Every day | ORAL | Status: DC
  Administered 2017-04-19 – 2017-04-20 (×2): 325 mg via ORAL
  Filled 2017-04-18 (×3): qty 1

## 2017-04-18 MED ORDER — CALCIUM CARBONATE ANTACID 500 MG PO CHEW
CHEWABLE_TABLET | ORAL | Status: AC
  Filled 2017-04-18: qty 1
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
## 2017-04-18 NOTE — Anesthesia Preprocedure Evaluation (Signed)
Anesthesia Evaluation  Patient identified by MRN, date of birth, ID band Patient awake    Reviewed: Allergy & Precautions, NPO status , Patient's Chart, lab work & pertinent test results  Airway Mallampati: II  TM Distance: >3 FB     Dental no notable dental hx.    Pulmonary asthma ,    Pulmonary exam normal        Cardiovascular negative cardio ROS Normal cardiovascular exam     Neuro/Psych negative neurological ROS  negative psych ROS   GI/Hepatic negative GI ROS, Neg liver ROS,   Endo/Other  negative endocrine ROS  Renal/GU negative Renal ROS  negative genitourinary   Musculoskeletal negative musculoskeletal ROS (+)   Abdominal Normal abdominal exam  (+)   Peds negative pediatric ROS (+)  Hematology negative hematology ROS (+)   Anesthesia Other Findings   Reproductive/Obstetrics (+) Pregnancy                             Anesthesia Physical Anesthesia Plan  ASA: II  Anesthesia Plan: Epidural   Post-op Pain Management:    Induction:   PONV Risk Score and Plan:   Airway Management Planned: Natural Airway  Additional Equipment:   Intra-op Plan:   Post-operative Plan:   Informed Consent: I have reviewed the patients History and Physical, chart, labs and discussed the procedure including the risks, benefits and alternatives for the proposed anesthesia with the patient or authorized representative who has indicated his/her understanding and acceptance.   Dental advisory given  Plan Discussed with: CRNA and Surgeon  Anesthesia Plan Comments:         Anesthesia Quick Evaluation

## 2017-04-18 NOTE — Discharge Instructions (Signed)
°Vaginal Delivery, Care After °Refer to this sheet in the next few weeks. These discharge instructions provide you with information on caring for yourself after delivery. Your caregiver may also give you specific instructions. Your treatment has been planned according to the most current medical practices available, but problems sometimes occur. Call your caregiver if you have any problems or questions after you go home. °HOME CARE INSTRUCTIONS °1. Take over-the-counter or prescription medicines only as directed by your caregiver or pharmacist. °2. Do not drink alcohol, especially if you are breastfeeding or taking medicine to relieve pain. °3. Do not smoke tobacco. °4. Continue to use good perineal care. Good perineal care includes: °1. Wiping your perineum from back to front °2. Keeping your perineum clean. °3. You can do sitz baths twice a day, to help keep this area clean °5. Do not use tampons, douche or have sex for 6 weeks °6. Shower only and avoid sitting in submerged water, aside from sitz baths °7. Wear a well-fitting bra that provides breast support. °8. Eat healthy foods. °9. Drink enough fluids to keep your urine clear or pale yellow. °10. Eat high-fiber foods such as whole grain cereals and breads, brown rice, beans, and fresh fruits and vegetables every day. These foods may help prevent or relieve constipation. °11. Avoid constipation with high fiber foods or medications, such as miralax or metamucil °12. Follow your caregiver's recommendations regarding resumption of activities such as climbing stairs, driving, lifting, exercising, or traveling. °13. Talk to your caregiver about resuming sexual activities. Resumption of sexual activities after 6 weeks is dependent upon your risk of infection, your rate of healing, and your comfort and desire to resume sexual activity. °14. Try to have someone help you with your household activities and your newborn for at least a few days after you leave the  hospital. °15. Rest as much as possible. Try to rest or take a nap when your newborn is sleeping. °16. Increase your activities gradually. °17. Keep all of your scheduled postpartum appointments. It is very important to keep your scheduled follow-up appointments. At these appointments, your caregiver will be checking to make sure that you are healing physically and emotionally. °SEEK MEDICAL CARE IF:  °· You are passing large clots from your vagina. Save any clots to show your caregiver. °· You have a foul smelling discharge from your vagina. °· You have trouble urinating. °· You are urinating frequently. °· You have pain when you urinate. °· You have a change in your bowel movements. °· You have increasing redness, pain, or swelling near your vaginal incision (episiotomy) or vaginal tear. °· You have pus draining from your episiotomy or vaginal tear. °· Your episiotomy or vaginal tear is separating. °· You have painful, hard, or reddened breasts. °· You have a severe headache. °· You have blurred vision or see spots. °· You feel sad or depressed. °· You have thoughts of hurting yourself or your newborn. °· You have questions about your care, the care of your newborn, or medicines. °· You are dizzy or light-headed. °· You have a rash. °· You have nausea or vomiting. °· You were breastfeeding and have not had a menstrual period within 12 weeks after you stopped breastfeeding. °· You are not breastfeeding and have not had a menstrual period by the 12th week after delivery. °· You have a fever of 100.5 or more °SEEK IMMEDIATE MEDICAL CARE IF:  °· You have persistent pain. °· You have chest pain. °· You have shortness   of breath. °· You faint. °· You have leg pain. °· You have stomach pain. °· Your vaginal bleeding saturates two or more sanitary pads in 1 hour. °MAKE SURE YOU:  °· Understand these instructions. °· Will watch your condition. °· Will get help right away if you are not doing well or get worse. °Document  Released: 09/20/2000 Document Revised: 02/07/2014 Document Reviewed: 05/20/2012 °ExitCare® Patient Information ©2015 [employer]. This information is not intended to replace advice given to you by your health care provider. Make sure you discuss any questions you have with your health care provider. ° °Sitz Bath °A sitz bath is a warm water bath taken in the sitting position. The water covers only the hips and butt (buttocks). We recommend using one that fits in the toilet, to help with ease of use and cleanliness. It may be used for either healing or cleaning purposes. Sitz baths are also used to relieve pain, itching, or muscle tightening (spasms). The water may contain medicine. Moist heat will help you heal and relax.  °HOME CARE  °Take 3 to 4 sitz baths a day. °18. Fill the bathtub half-full with warm water. °19. Sit in the water and open the drain a little. °20. Turn on the warm water to keep the tub half-full. Keep the water running constantly. °21. Soak in the water for 15 to 20 minutes. °22. After the sitz bath, pat the affected area dry. °GET HELP RIGHT AWAY IF: °You get worse instead of better. Stop the sitz baths if you get worse. °MAKE SURE YOU: °· Understand these instructions. °· Will watch your condition. °· Will get help right away if you are not doing well or get worse. °Document Released: 10/31/2004 Document Revised: 06/17/2012 Document Reviewed: 01/21/2011 °ExitCare® Patient Information ©2015 [employer]. This information is not intended to replace advice given to you by your health care provider. Make sure you discuss any questions you have with your health care provider. ° ° °

## 2017-04-18 NOTE — Anesthesia Procedure Notes (Signed)
Epidural Patient location during procedure: OB Start time: 04/18/2017 4:14 AM End time: 04/18/2017 4:22 AM  Staffing Anesthesiologist: Yves DillARROLL, Breyon Sigg Performed: anesthesiologist   Preanesthetic Checklist Completed: patient identified, site marked, surgical consent, pre-op evaluation, timeout performed, IV checked, risks and benefits discussed and monitors and equipment checked  Epidural Patient position: sitting Prep: Betadine Patient monitoring: heart rate, continuous pulse ox and blood pressure Approach: midline Location: L4-L5 Injection technique: LOR air  Needle:  Needle type: Tuohy  Needle gauge: 18 G Needle length: 9 cm and 9 Catheter type: closed end flexible Catheter size: 20 Guage Test dose: negative and 1.5% lidocaine with Epi 1:200 K  Assessment Events: blood not aspirated, injection not painful, no injection resistance, negative IV test and no paresthesia  Additional Notes Time out called.  Patient placed in sitting position.  Back prepped and draped in sterile fashion.  A skin wheal was made in the L3-L4 interspace with 1% Lidocaine plain.  An 18G Tuohy needle was guided into the epidural space by a loss of resistance technique.  The epidural catheter was threaded 3 cm into the space and the TD was negative.  No blood or paresthesias.  Easy epidural X 1stick and patient tolerated the procedure well.Reason for block:procedure for pain

## 2017-04-18 NOTE — Discharge Summary (Signed)
Physician Obstetric Discharge Summary  Patient ID: Rogue BussingShakirah L Oestreich MRN: [id number] DOB/AGE: 32/05/1986 32 y.o.   Date of Admission: 04/18/2017  Date of Discharge: Term pregnancy delivers s/p postpartum tubal ligation  Admitting Diagnosis: Onset of Labor at 6633w4d  Secondary Diagnosis: none  Mode of Delivery: normal spontaneous vaginal delivery 04/18/2017      Discharge Diagnosis: Term intrauterine pregnancy-delivered   Intrapartum Procedures: Atificial rupture of membranes and epidural   Post partum procedures: postpartum tubal ligation  Complications: none   Brief Hospital Course  Rogue BussingShakirah L Goar is a [id number] who had a SVD on 04/18/2017;  for further details of this delivery, please refer to the delivery note.  Patient had an uncomplicated postpartum course, underwent uncomplicated postpartum BTL on PPD1.  By time of discharge on PPD#2, her pain was controlled on oral pain medications; she had appropriate lochia and was ambulating, voiding without difficulty and tolerating regular diet.  She was deemed stable for discharge to home.    Labs: CBC Latest Ref Rng & Units 04/19/2017 04/18/2017 01/28/2017  WBC 3.6 - 11.0 K/uL 12.5(H) 14.0(H) 9.5  Hemoglobin 12.0 - 16.0 g/dL 11.0(L) 12.7 11.1  Hematocrit 35.0 - 47.0 % 32.5(L) 36.2 33.7(L)  Platelets 150 - 440 K/uL 232 250 246   O POS/ RI  Physical exam:  Blood pressure 119/65, pulse 64, temperature 97.9 F (36.6 C), temperature source Oral, resp. rate 17, height 5\' 2"  (1.575 m), weight 145 lb (65.8 kg), last menstrual period 02/22/2016, SpO2 100 %, unknown if currently breastfeeding. General: alert and no distress Lochia: appropriate Abdomen: soft, NT Uterine Fundus: firm Incision: healing well, no significant drainage, no dehiscence, no significant erythema Extremities: No evidence of DVT seen on physical exam. No lower extremity edema.  Discharge Instructions: Per After Visit Summary. Activity: Advance as tolerated. Pelvic rest  for 6 weeks.  Also refer to Discharge Instructions Diet: Regular Medications: Allergies as of 04/20/2017   No Known Allergies     Medication List    STOP taking these medications   TYLENOL PO     TAKE these medications   ferrous sulfate 325 (65 FE) MG tablet Take 1 tablet (325 mg total) by mouth daily with breakfast.   ibuprofen 600 MG tablet Commonly known as:  ADVIL,MOTRIN Take 1 tablet (600 mg total) by mouth every 6 (six) hours as needed for mild pain, moderate pain or cramping.   oxyCODONE-acetaminophen 5-325 MG tablet Commonly known as:  PERCOCET/ROXICET Take 1 tablet by mouth every 4 (four) hours as needed for severe pain.   prenatal multivitamin Tabs tablet Take 1 tablet by mouth daily at 12 noon.      Outpatient follow up:  Follow-up Information    Reva BoresPratt, Tanya S, MD. Schedule an appointment as soon as possible for a visit.   Specialty:  Obstetrics and Gynecology Why:  for 6 week postpartum check Contact information: [address] [place] [address] [phone]          Postpartum contraception: bilateral tubal ligation  Discharged Condition: good  Discharged to: home   Newborn Data: Disposition:home with mother  Apgars: APGAR (1 MIN): 9   APGAR (5 MINS): 9   APGAR (10 MINS):    Baby Feeding: Breast  Vena AustriaAndreas Ruthell Feigenbaum, MD 04/20/2017 10:38 AM

## 2017-04-18 NOTE — Anesthesia Preprocedure Evaluation (Addendum)
Anesthesia Evaluation  Patient identified by MRN, date of birth, ID band Patient awake    Reviewed: Allergy & Precautions, NPO status , Patient's Chart, lab work & pertinent test results  History of Anesthesia Complications Negative for: history of anesthetic complications  Airway Mallampati: II       Dental   Pulmonary asthma (no inhalers in several months) ,           Cardiovascular negative cardio ROS       Neuro/Psych negative neurological ROS     GI/Hepatic Neg liver ROS, GERD (with pregnancy)  ,  Endo/Other  negative endocrine ROS  Renal/GU negative Renal ROS     Musculoskeletal   Abdominal   Peds  Hematology negative hematology ROS (+)   Anesthesia Other Findings Past Medical History: 12/15/2013: Abnormal Pap smear of cervix     Comment:  Records requested from previous prenatal provider Pap               negative 06/29/14 Return to routine screening per ASCCP               guidelines  No date: Asthma 2008: PPD positive, treated   Reproductive/Obstetrics (+) Pregnancy and Breast feeding                             Anesthesia Physical Anesthesia Plan  ASA: II and emergent  Anesthesia Plan: General   Post-op Pain Management:    Induction:   PONV Risk Score and Plan: 4 or greater and Ondansetron, Dexamethasone, Propofol, Midazolam and Scopolamine patch - Pre-op  Airway Management Planned:   Additional Equipment:   Intra-op Plan:   Post-operative Plan:   Informed Consent: I have reviewed the patients History and Physical, chart, labs and discussed the procedure including the risks, benefits and alternatives for the proposed anesthesia with the patient or authorized representative who has indicated his/her understanding and acceptance.     Plan Discussed with:   Anesthesia Plan Comments:         Anesthesia Quick Evaluation

## 2017-04-18 NOTE — OB Triage Note (Signed)
Patient came in for observation for labor evaluation. Patient reports regular uterine contractions every two to three minutes and rates her pain 10 out of 10. Patient states contractions started yesterday after her doctor's appointment where they striped her membranes. Patient reports bloody mucous vaginal discharge. Patient denies leaking of fluid. Vital signs stable and patient afebrile. FHR baseline 130 with moderate variability with accelerations 15 x 15 and no decelerations. Family at bedside. Will continue to monitor.

## 2017-04-19 ENCOUNTER — Inpatient Hospital Stay: Payer: BLUE CROSS/BLUE SHIELD | Admitting: Anesthesiology

## 2017-04-19 ENCOUNTER — Encounter
Admission: EM | Disposition: A | Discharge status: Home / Self Care | Payer: Self-pay | Source: Home / Self Care | Attending: Obstetrics & Gynecology

## 2017-04-19 DIAGNOSIS — Z302 Encounter for sterilization: Secondary | ICD-10-CM

## 2017-04-19 HISTORY — PX: TUBAL LIGATION: SHX77

## 2017-04-19 LAB — CBC
HEMATOCRIT: 32.5 % — AB (ref 35.0–47.0)
HEMOGLOBIN: 11 g/dL — AB (ref 12.0–16.0)
MCH: 32 pg (ref 26.0–34.0)
MCHC: 33.7 g/dL (ref 32.0–36.0)
MCV: 94.8 fL (ref 80.0–100.0)
Platelets: 232 10*3/uL (ref 150–440)
RBC: 3.43 MIL/uL — AB (ref 3.80–5.20)
RDW: 14.7 % — ABNORMAL HIGH (ref 11.5–14.5)
WBC: 12.5 10*3/uL — ABNORMAL HIGH (ref 3.6–11.0)

## 2017-04-19 LAB — RPR: RPR: NONREACTIVE

## 2017-04-19 SURGERY — LIGATION, FALLOPIAN TUBE, POSTPARTUM
Anesthesia: General

## 2017-04-19 MED ORDER — LIDOCAINE HCL (CARDIAC) 20 MG/ML IV SOLN
INTRAVENOUS | Status: DC | PRN
  Administered 2017-04-19: 60 mg via INTRAVENOUS

## 2017-04-19 MED ORDER — SUGAMMADEX SODIUM 200 MG/2ML IV SOLN
INTRAVENOUS | Status: DC | PRN
  Administered 2017-04-19: 130 mg via INTRAVENOUS

## 2017-04-19 MED ORDER — FENTANYL CITRATE (PF) 100 MCG/2ML IJ SOLN
INTRAMUSCULAR | Status: DC | PRN
Start: 2017-04-19 — End: 2017-04-19
  Administered 2017-04-19 (×2): 50 ug via INTRAVENOUS

## 2017-04-19 MED ORDER — FENTANYL CITRATE (PF) 100 MCG/2ML IJ SOLN
25.0000 ug | INTRAMUSCULAR | Status: DC | PRN
  Administered 2017-04-19 (×2): 25 ug via INTRAVENOUS

## 2017-04-19 MED ORDER — MIDAZOLAM HCL 2 MG/2ML IJ SOLN
INTRAMUSCULAR | Status: AC
  Filled 2017-04-19: qty 2

## 2017-04-19 MED ORDER — LIDOCAINE HCL 2 % EX GEL
CUTANEOUS | Status: AC
  Filled 2017-04-19: qty 5

## 2017-04-19 MED ORDER — ONDANSETRON HCL 4 MG/2ML IJ SOLN: 4.0000 mg | Freq: Once | INTRAMUSCULAR | Status: DC | PRN

## 2017-04-19 MED ORDER — FENTANYL CITRATE (PF) 100 MCG/2ML IJ SOLN
INTRAMUSCULAR | Status: AC
  Filled 2017-04-19: qty 2

## 2017-04-19 MED ORDER — ONDANSETRON HCL 4 MG/2ML IJ SOLN
INTRAMUSCULAR | Status: DC | PRN
  Administered 2017-04-19: 4 mg via INTRAVENOUS

## 2017-04-19 MED ORDER — ONDANSETRON HCL 4 MG/2ML IJ SOLN
INTRAMUSCULAR | Status: AC
  Filled 2017-04-19: qty 2

## 2017-04-19 MED ORDER — ROCURONIUM BROMIDE 50 MG/5ML IV SOLN
INTRAVENOUS | Status: AC
  Filled 2017-04-19: qty 1

## 2017-04-19 MED ORDER — BUPIVACAINE HCL 0.5 % IJ SOLN
INTRAMUSCULAR | Status: DC | PRN
  Administered 2017-04-19: 3 mL

## 2017-04-19 MED ORDER — DEXAMETHASONE SODIUM PHOSPHATE 10 MG/ML IJ SOLN
INTRAMUSCULAR | Status: DC | PRN
  Administered 2017-04-19: 5 mg via INTRAVENOUS

## 2017-04-19 MED ORDER — ARTIFICIAL TEARS OPHTHALMIC OINT
TOPICAL_OINTMENT | OPHTHALMIC | Status: AC
  Filled 2017-04-19: qty 3.5

## 2017-04-19 MED ORDER — ROCURONIUM BROMIDE 100 MG/10ML IV SOLN
INTRAVENOUS | Status: DC | PRN
  Administered 2017-04-19: 20 mg via INTRAVENOUS

## 2017-04-19 MED ORDER — IBUPROFEN 600 MG PO TABS
600.0000 mg | ORAL_TABLET | Freq: Four times a day (QID) | ORAL | Status: DC
  Administered 2017-04-19 – 2017-04-20 (×3): 600 mg via ORAL
  Filled 2017-04-19 (×3): qty 1

## 2017-04-19 MED ORDER — MIDAZOLAM HCL 2 MG/2ML IJ SOLN
INTRAMUSCULAR | Status: DC | PRN
  Administered 2017-04-19: 2 mg via INTRAVENOUS

## 2017-04-19 MED ORDER — DEXAMETHASONE SODIUM PHOSPHATE 10 MG/ML IJ SOLN
INTRAMUSCULAR | Status: AC
  Filled 2017-04-19: qty 1

## 2017-04-19 MED ORDER — LACTATED RINGERS IV SOLN
INTRAVENOUS | Status: DC | PRN
  Administered 2017-04-19: 14:00:00 via INTRAVENOUS

## 2017-04-19 MED ORDER — PROPOFOL 10 MG/ML IV BOLUS
INTRAVENOUS | Status: AC
  Filled 2017-04-19: qty 20

## 2017-04-19 MED ORDER — SUCCINYLCHOLINE CHLORIDE 20 MG/ML IJ SOLN
INTRAMUSCULAR | Status: DC | PRN
  Administered 2017-04-19: 100 mg via INTRAVENOUS

## 2017-04-19 MED ORDER — SUGAMMADEX SODIUM 200 MG/2ML IV SOLN
INTRAVENOUS | Status: AC
  Filled 2017-04-19: qty 2

## 2017-04-19 MED ORDER — PROPOFOL 10 MG/ML IV BOLUS
INTRAVENOUS | Status: DC | PRN
  Administered 2017-04-19: 150 mg via INTRAVENOUS

## 2017-04-19 MED ORDER — BUPIVACAINE HCL (PF) 0.5 % IJ SOLN
INTRAMUSCULAR | Status: AC
  Filled 2017-04-19: qty 30

## 2017-04-19 SURGICAL SUPPLY — 28 items
CHLORAPREP W/TINT 26ML (MISCELLANEOUS) ×3 IMPLANT
DERMABOND ADVANCED (GAUZE/BANDAGES/DRESSINGS) ×2
DERMABOND ADVANCED .7 DNX12 (GAUZE/BANDAGES/DRESSINGS) ×1 IMPLANT
DRAPE LAPAROTOMY 100X77 ABD (DRAPES) ×3 IMPLANT
DRSG TEGADERM 2-3/8X2-3/4 SM (GAUZE/BANDAGES/DRESSINGS) ×3 IMPLANT
DRSG TEGADERM 4X4.75 (GAUZE/BANDAGES/DRESSINGS) ×3 IMPLANT
DRSG TELFA 3X8 NADH (GAUZE/BANDAGES/DRESSINGS) ×3 IMPLANT
DRSG TELFA 4X3 1S NADH ST (GAUZE/BANDAGES/DRESSINGS) ×3 IMPLANT
ELECT REM PT RETURN 9FT ADLT (ELECTROSURGICAL) ×3
ELECTRODE REM PT RTRN 9FT ADLT (ELECTROSURGICAL) ×1 IMPLANT
GLOVE BIO SURGEON STRL SZ7 (GLOVE) ×3 IMPLANT
GLOVE INDICATOR 7.5 STRL GRN (GLOVE) ×3 IMPLANT
GOWN STRL REUS W/ TWL LRG LVL3 (GOWN DISPOSABLE) ×2 IMPLANT
GOWN STRL REUS W/TWL LRG LVL3 (GOWN DISPOSABLE) ×4
LABEL OR SOLS (LABEL) ×3 IMPLANT
NDL SAFETY 22GX1.5 (NEEDLE) ×3 IMPLANT
NS IRRIG 500ML POUR BTL (IV SOLUTION) ×3 IMPLANT
PACK BASIN MINOR ARMC (MISCELLANEOUS) ×3 IMPLANT
SPONGE LAP 4X18 5PK (MISCELLANEOUS) ×3 IMPLANT
STRAP SAFETY BODY (MISCELLANEOUS) ×3 IMPLANT
SUT CHROMIC GUT BROWN 0 54 (SUTURE) ×1 IMPLANT
SUT CHROMIC GUT BROWN 0 54IN (SUTURE) ×3
SUT MNCRL 4-0 (SUTURE) ×2
SUT MNCRL 4-0 27XMFL (SUTURE) ×1
SUT VIC AB 0 UR5 27 (SUTURE) ×3 IMPLANT
SUT VIC AB 2-0 UR6 27 (SUTURE) ×6 IMPLANT
SUTURE MNCRL 4-0 27XMF (SUTURE) ×1 IMPLANT
SYRINGE 10CC LL (SYRINGE) ×3 IMPLANT

## 2017-04-19 NOTE — Anesthesia Procedure Notes (Signed)
Procedure Name: Intubation Date/Time: 04/19/2017 2:20 PM Performed by: Hedda Slade Pre-anesthesia Checklist: Patient identified, Patient being monitored, Timeout performed, Emergency Drugs available and Suction available Patient Re-evaluated:Patient Re-evaluated prior to induction Oxygen Delivery Method: Circle system utilized Preoxygenation: Pre-oxygenation with 100% oxygen Induction Type: IV induction, Rapid sequence and Cricoid Pressure applied Laryngoscope Size: Mac and 3 Grade View: Grade I Tube type: Oral Tube size: 7.0 mm Number of attempts: 1 Airway Equipment and Method: Stylet Placement Confirmation: ETT inserted through vocal cords under direct vision,  positive ETCO2 and breath sounds checked- equal and bilateral Secured at: 21 cm Tube secured with: Tape Dental Injury: Teeth and Oropharynx as per pre-operative assessment

## 2017-04-19 NOTE — Progress Notes (Signed)
  Subjective:  Doing well, minimal lochia  Objective:   Blood pressure 111/71, pulse 79, temperature 98.3 F (36.8 C), temperature source Oral, resp. rate 17, height 5\' 2"  (1.575 m), weight 145 lb (65.8 kg), last menstrual period 02/22/2016, SpO2 100 %, unknown if currently breastfeeding.  General: NAD Pulmonary: no increased work of breathing Abdomen: non-distended, non-tender, fundus firm at level of umbilicus Extremities: no edema, no erythema, no tenderness  Results for orders placed or performed during the hospital encounter of 04/18/17 (from the past 72 hour(s))  CBC     Status: Abnormal   Collection Time: 04/18/17  2:58 AM  Result Value Ref Range   WBC 14.0 (H) 3.6 - 11.0 K/uL   RBC 3.84 3.80 - 5.20 MIL/uL   Hemoglobin 12.7 12.0 - 16.0 g/dL   HCT 45.436.2 09.835.0 - 11.947.0 %   MCV 94.3 80.0 - 100.0 fL   MCH 33.1 26.0 - 34.0 pg   MCHC 35.1 32.0 - 36.0 g/dL   RDW 14.714.4 82.911.5 - 56.214.5 %   Platelets 250 150 - 440 K/uL  Type and screen [facility]     Status: None   Collection Time: 04/18/17  2:58 AM  Result Value Ref Range   ABO/RH(D) O POS    Antibody Screen NEG    Sample Expiration 04/21/2017   RPR     Status: None   Collection Time: 04/18/17  2:58 AM  Result Value Ref Range   RPR Ser Ql Non Reactive Non Reactive    Comment: (NOTE) Performed At: [facility] [address] [place], [place] [id number] Mila HomerHancock William F MD ON:[id number]:[phone]   CBC     Status: Abnormal   Collection Time: 04/19/17  5:13 AM  Result Value Ref Range   WBC 12.5 (H) 3.6 - 11.0 K/uL   RBC 3.43 (L) 3.80 - 5.20 MIL/uL   Hemoglobin 11.0 (L) 12.0 - 16.0 g/dL   HCT 44.032.5 (L) 10.235.0 - 72.547.0 %   MCV 94.8 80.0 - 100.0 fL   MCH 32.0 26.0 - 34.0 pg   MCHC 33.7 32.0 - 36.0 g/dL   RDW 36.614.7 (H) 44.011.5 - 34.714.5 %   Platelets 232 150 - 440 K/uL    Assessment:   31 y.o. [id number] postpartum day # 1 TSVD desiring postpartum BTL  Plan:    1) Acute blood loss anemia - hemodynamically stable and  asymptomatic - po ferrous sulfate  2) Blood Type --/--/O POS (07/13 0258) / Rubella 11.00 (12/20 1012) / V  3) TDAP status not on recors  4) Proceed with BTL today - Other reversible forms of contraception were discussed with patient; she declines all other modalities. Permanent nature of as well as associated risks of the procedure discussed with patient including but not limited to: risk of regret, permanence of method, bleeding, infection, injury to surrounding organs and need for additional procedures.  Failure risk of 0.5-1% with increased risk of ectopic gestation if pregnancy occurs was also discussed with patient.    5) Disposition - anticipate discharge PPD2 POD1

## 2017-04-19 NOTE — Anesthesia Postprocedure Evaluation (Signed)
Anesthesia Post Note  Patient: Brittney Ashley  Procedure(s) Performed: * No procedures listed *  Patient location during evaluation: Mother Baby Anesthesia Type: Epidural Level of consciousness: awake and alert and oriented Pain management: satisfactory to patient Vital Signs Assessment: post-procedure vital signs reviewed and stable Respiratory status: spontaneous breathing Cardiovascular status: blood pressure returned to baseline and stable Postop Assessment: no headache and no backache Anesthetic complications: no     Last Vitals:  Vitals:   04/19/17 0415 04/19/17 0849  BP: 115/73 111/71  Pulse: 88 79  Resp: 18 17  Temp: 36.8 C 36.8 C    Last Pain:  Vitals:   04/19/17 0849  TempSrc: Oral  PainSc:                  Carin Hockrisson,  Javyn Havlin B

## 2017-04-19 NOTE — Op Note (Signed)
Preoperative Diagnosis: 1) 30 y.o.  [id number] 2) Undesired fertility  Postoperative Diagnosis: 1) 31 y.o.  [id number] 2) Undesired fertility  Operation Performed: Postpartum bilateral tubal ligation via Pomeroy method  Indication: 31 y.o. [id number]  with undesired fertility, desires permanent sterilization.  Other reversible forms of contraception were discussed with patient; she declines all other modalities. Permanent nature of as well as associated risks of the procedure discussed with patient including but not limited to: risk of regret, permanence of method, bleeding, infection, injury to surrounding organs and need for additional procedures.  Failure risk of 0.5-1% with increased risk of ectopic gestation if pregnancy occurs was also discussed with patient.    Surgeon: Vena AustriaAndreas Deegan Valentino, MD  Anesthesia: Choice  Preoperative Antibiotics: none  Estimated Blood Loss: minimal IV Fluids: 350mL  Urine Output:: N/A  Drains or Tubes: none  Implants: none  Specimens Removed: Bilateral segment of fallopian tube  Complications: none  Intraoperative Findings: Normal tubes, ovaries, and uterine fundus.  Good 1cm portion of tube excised from each fallopian tube with tubal ostia visualized.  Patient Condition: stable  Procedure in Detail:  Patient was taken to the operating room where she was administered general anesthesia.  She was positioned in the supine position, prepped and draped in the usual sterile fashion.  Prior to proceeding with procedure a time out was performed.  Attention was turned to the patient's abdomen.  The umbilicus was infiltrated with 1% Sensorcaine, before making a 3cm vertical infraumbilical stab incision using an 11 blade scalpel.  The subcutaneous tissue was dissected of the rectus fascia using a hemostat.  The fascia was then grasped with the hemostat, tented up, regrasped with second hemostat before releasing and re-grasping with the first hemostat to verify no bowl  was grasped.  The fascia was incised using a mayo scissors.  The fascial edges were taged with 0 Vicryl stay sutures prior to removing them.  Arm[employer] retractors were placed the peritoneum was identified, grasped with a hemostat, incised using metzenbaum scissors after verifying that the tissue was translucent by placing the scissors behind the tissue to verify it was free of omentum or bowl.  The operators fingers was placed through the incision and noted it to be free of adhesive disease or umbilical hernias.  The table was put into left tilt, a small moist lap pad tagged with a hemostat was then placed through in incision using Singley forceps to pack away and bowl or omentum.  The right fallopian tube was identified and walked out to its fimbriated end using the Singley forceps and a Babcock clamps.  A mid isthmic portion of tube was then suture ligated twice using a 0 chromic wheel.  The intervening knuckle of tube was then excised.  The tube was noted to be hemostatic before returning it to the abdomen.  The mini-lap was removed and the patient table was tilted to the right with the same procedure being repeated for the patient's left tube.    The previously placed stay sutures were tied together, no fascial defects were noted after closure.  The skin was closed using a 4-0 Monocryl in a subcuticular fashion.  The incision was then dressed with surgical skin glue.  Sponge needle and instrument counts were correct time two.  The patient tolerated the procedure well and was taken to the recovery room in stable condition.

## 2017-04-19 NOTE — Transfer of Care (Signed)
Immediate Anesthesia Transfer of Care Note  Patient: Rogue BussingShakirah L Fels  Procedure(s) Performed: Procedure(s): POST PARTUM TUBAL LIGATION (N/A)  Patient Location: PACU  Anesthesia Type:General  Level of Consciousness: sedated  Airway & Oxygen Therapy: Patient Spontanous Breathing and Patient connected to face mask oxygen  Post-op Assessment: Report given to RN and Post -op Vital signs reviewed and stable  Post vital signs: Reviewed and stable  Last Vitals:  Vitals:   04/19/17 1231 04/19/17 1458  BP: 110/64 120/71  Pulse: 85 91  Resp: 17 14  Temp: 36.9 C (!) 36.1 C    Last Pain:  Vitals:   04/19/17 1458  TempSrc:   PainSc: Asleep         Complications: No apparent anesthesia complications

## 2017-04-19 NOTE — Anesthesia Post-op Follow-up Note (Cosign Needed)
Anesthesia QCDR form completed.        

## 2017-04-19 NOTE — Anesthesia Postprocedure Evaluation (Signed)
Anesthesia Post Note  Patient: Rogue BussingShakirah L Merryfield  Procedure(s) Performed: Procedure(s) (LRB): POST PARTUM TUBAL LIGATION (N/A)  Patient location during evaluation: PACU Anesthesia Type: General Level of consciousness: awake and alert Pain management: pain level controlled Vital Signs Assessment: post-procedure vital signs reviewed and stable Respiratory status: spontaneous breathing and respiratory function stable Cardiovascular status: stable Anesthetic complications: no     Last Vitals:  Vitals:   04/19/17 1231 04/19/17 1458  BP: 110/64 120/71  Pulse: 85 91  Resp: 17 14  Temp: 36.9 C (!) 36.1 C    Last Pain:  Vitals:   04/19/17 1458  TempSrc:   PainSc: Asleep                 KEPHART,WILLIAM K

## 2017-04-20 ENCOUNTER — Encounter: Payer: Self-pay | Admitting: Obstetrics and Gynecology

## 2017-04-20 MED ORDER — IBUPROFEN 600 MG PO TABS: 600.0000 mg | ORAL_TABLET | Freq: Four times a day (QID) | ORAL | 0 refills | Status: DC | PRN

## 2017-04-20 MED ORDER — FERROUS SULFATE 325 (65 FE) MG PO TABS: 325.0000 mg | ORAL_TABLET | Freq: Every day | ORAL | 0 refills | Status: DC

## 2017-04-20 MED ORDER — OXYCODONE-ACETAMINOPHEN 5-325 MG PO TABS: 1.0000 | ORAL_TABLET | ORAL | 0 refills | Status: DC | PRN

## 2017-04-20 NOTE — Progress Notes (Signed)
Pt discharged home with infant.  Discharge instructions and follow up appointment given to and reviewed with pt.  Pt verbalized understanding.  Escorted by staff. 

## 2017-04-22 LAB — SURGICAL PATHOLOGY

## 2017-04-24 ENCOUNTER — Encounter: Payer: Self-pay | Admitting: Family Medicine

## 2017-04-24 ENCOUNTER — Telehealth: Payer: Self-pay

## 2017-04-24 NOTE — Telephone Encounter (Signed)
Either or if she feels symptoms are significant and she can't get an appointment then the emergency room

## 2017-04-24 NOTE — Telephone Encounter (Signed)
Please advise 

## 2017-04-24 NOTE — Telephone Encounter (Signed)
Pt aware, via voicemail, of AMS message

## 2017-04-24 NOTE — Telephone Encounter (Signed)
Pt has concerns about some issues she was having prior to her surg.  She keeps getting sharp pain in her stomach and feels like stomach keeps getting hard.  Feet have swelled up too.  Should she go to ER or the [facility]?  [phone]

## 2017-05-22 ENCOUNTER — Encounter: Payer: Self-pay | Admitting: Obstetrics and Gynecology

## 2017-05-22 ENCOUNTER — Ambulatory Visit (INDEPENDENT_AMBULATORY_CARE_PROVIDER_SITE_OTHER): Payer: BLUE CROSS/BLUE SHIELD | Admitting: Obstetrics and Gynecology

## 2017-05-22 ENCOUNTER — Other Ambulatory Visit (HOSPITAL_COMMUNITY)
Admission: RE | Admit: 2017-05-22 | Discharge: 2017-05-22 | Disposition: A | Discharge status: Home / Self Care | Payer: BLUE CROSS/BLUE SHIELD | Source: Ambulatory Visit | Attending: Obstetrics and Gynecology | Admitting: Obstetrics and Gynecology

## 2017-05-22 VITALS — BP 137/80 | HR 68 | Wt 131.0 lb

## 2017-05-22 DIAGNOSIS — Z3689 Encounter for other specified antenatal screening: Secondary | ICD-10-CM

## 2017-05-22 DIAGNOSIS — Z1151 Encounter for screening for human papillomavirus (HPV): Secondary | ICD-10-CM | POA: Diagnosis not present

## 2017-05-22 DIAGNOSIS — Z124 Encounter for screening for malignant neoplasm of cervix: Secondary | ICD-10-CM | POA: Insufficient documentation

## 2017-05-22 NOTE — Progress Notes (Signed)
Obstetrics Visit Postpartum Visit  Appointment Date: 05/22/2017  [facility]  Primary Care Provider: Patient, No Pcp Per  Chief Complaint:  Chief Complaint  Patient presents with  . Postpartum Care    History of Present Illness: Brittney Ashley is a 31 y.o. African-American [id number] (No LMP recorded.), seen for the above chief complaint. Her past medical history is significant for nothing   She is s/p SVD/intact perineum on 7/13; she was discharged to home on PPD#2  Vaginal bleeding or discharge: none currently Breast or formula feeding: both Intercourse: Yes, and no issues Contraception after delivery: ppBTL PP depression s/s: No  Any bowel or bladder issues: No  Pap smear: no abnormalities (date: 2015)  Review of Systems: as noted in the History of Present Illness.  Medications PNV  Allergies Patient has no known allergies.  Physical Exam:  BP 137/80   Pulse 68   Wt 131 lb (59.4 kg)   BMI 23.96 kg/m  Body mass index is 23.96 kg/m. General appearance: Well nourished, well developed female in no acute distress.  Respiratory:  Normal respiratory effort Abdomen: positive bowel sounds and no masses, hernias; diffusely non tender to palpation, non distended Neuro/Psych:  Normal mood and affect.  Skin:  Warm and dry.  Lymphatic:  No inguinal lymphadenopathy.   Pelvic exam: is not limited by body habitus EGBUS: within normal limits Vagina: within normal limits and with no blood in the vault, Cervix:  no lesions or cervical motion tenderness Uterus:  Nonenlarged, nttp Adnexa:  normal adnexa and no mass, fullness, tenderness Rectovaginal: deferred  Laboratory: none  PP Depression Screening:  2, #10: no  Assessment: pt doing well  Plan:  Routine care, pap smear done today.   RTC PRN  Cornelia Copaharlie Yuko Coventry, Jr MD Attending [facility](Faculty Practice)

## 2017-05-26 LAB — CYTOLOGY - PAP
DIAGNOSIS: NEGATIVE
HPV (WINDOPATH): NOT DETECTED

## 2017-07-24 ENCOUNTER — Encounter: Payer: Self-pay | Admitting: Obstetrics and Gynecology

## 2017-10-05 IMAGING — US US OB TRANSVAGINAL
1 series · 15 of 28 positions shown · non-contrast
Comparison: None.

CLINICAL DATA: Early pregnancy, dating

EXAM:
OBSTETRIC <14 WK US AND TRANSVAGINAL OB US
TECHNIQUE: Both transabdominal and transvaginal ultrasound examinations were
performed for complete evaluation of the gestation as well as the
maternal uterus, adnexal regions, and pelvic cul-de-sac.
Transvaginal technique was performed to assess early pregnancy.

[Series 1: us ob transvaginal · 15 of 66 slices shown]
[im 1/66]
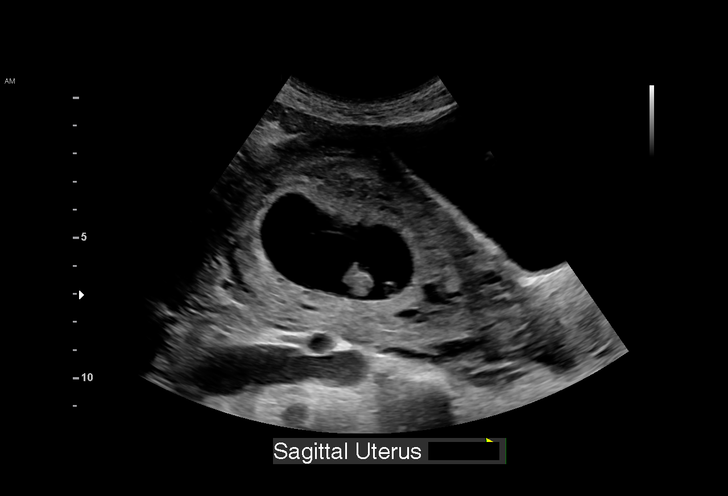
[im 5/66]
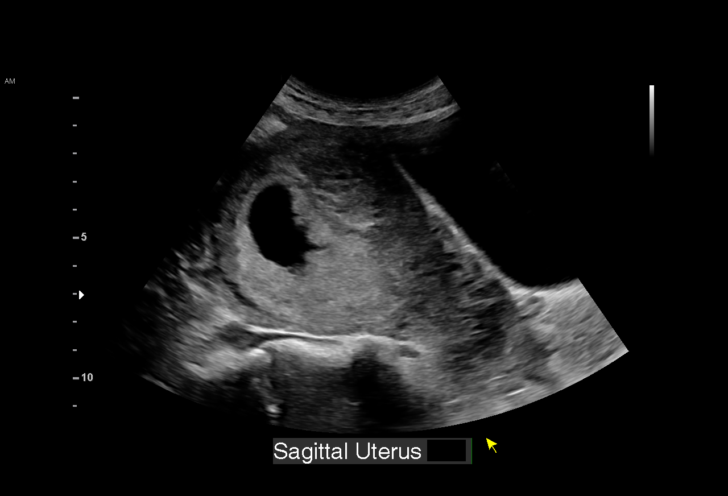
[im 10/66]
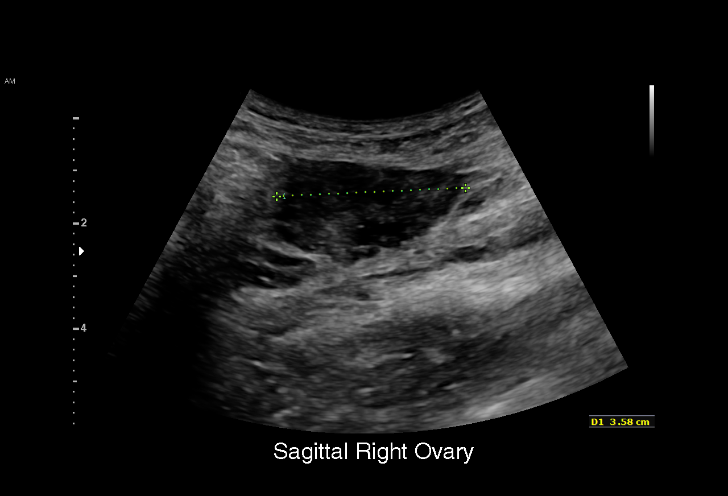
[im 15/66]
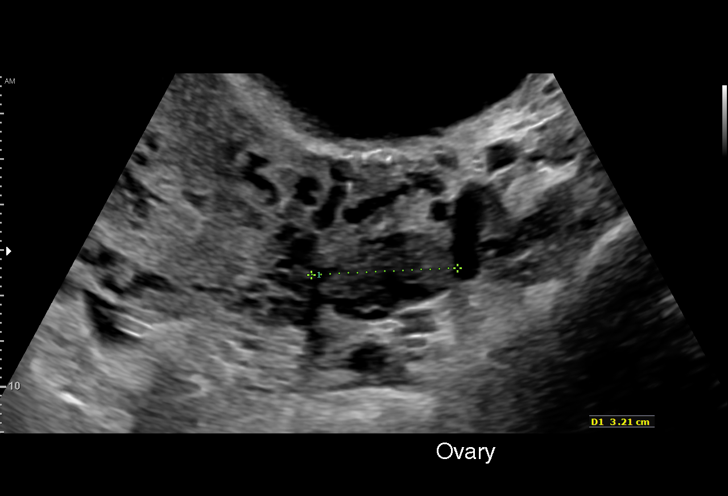
[im 20/66]
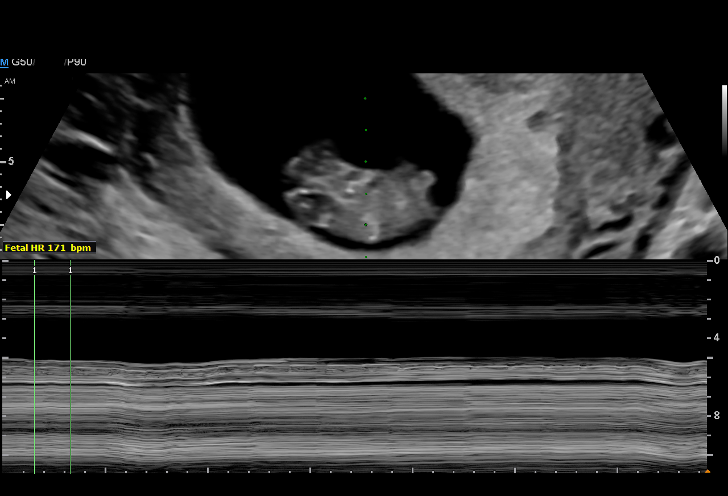
[im 25/66]
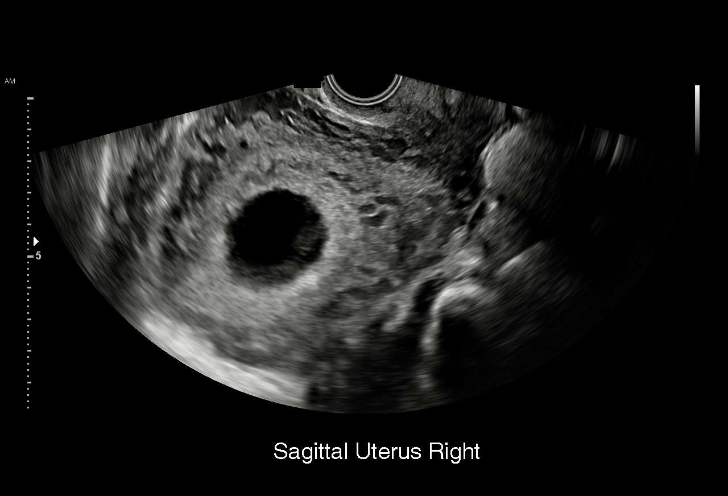
[im 29/66]
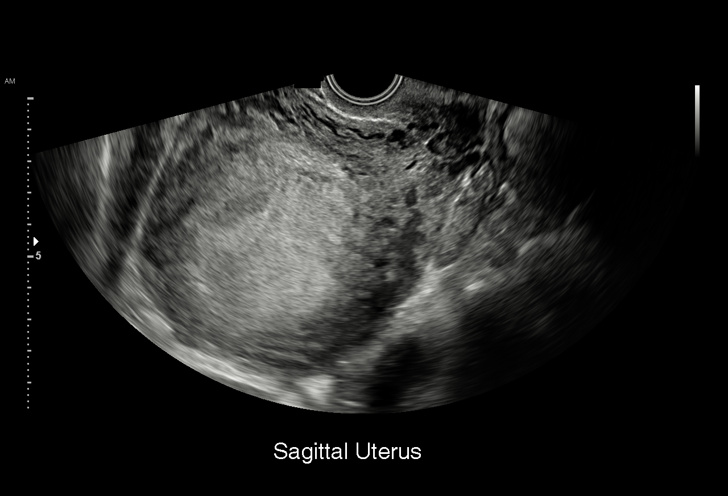
[im 34/66]
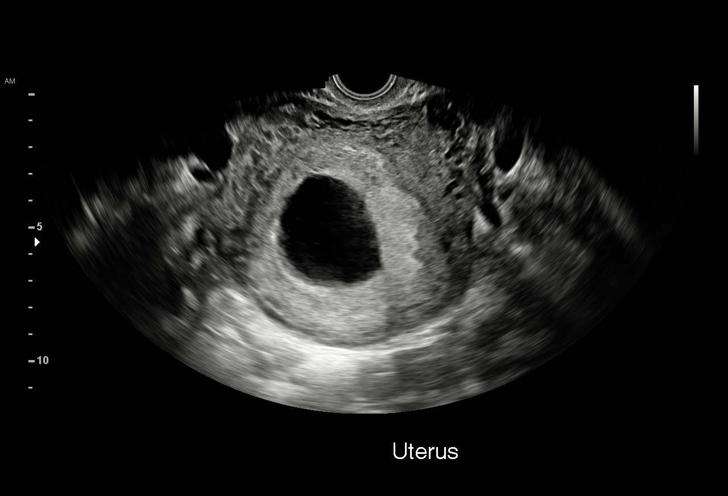
[im 37/66]
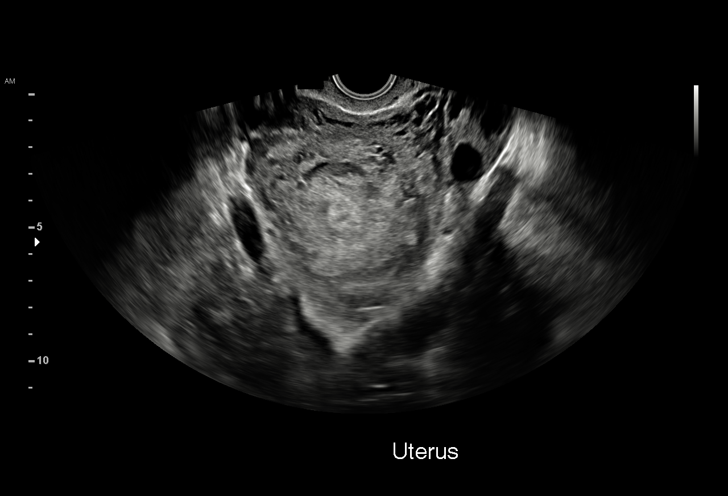
[im 41/66]
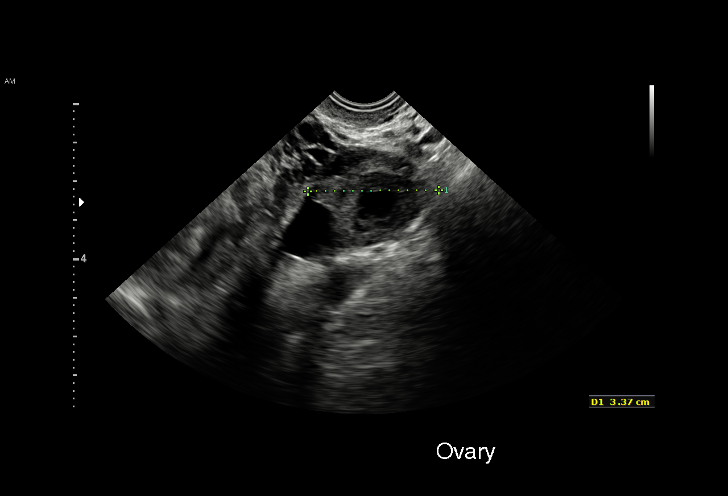
[im 46/66]
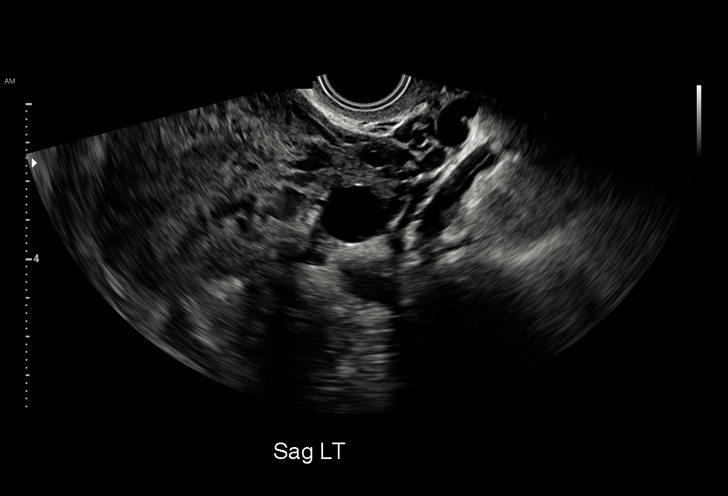
[im 51/66]
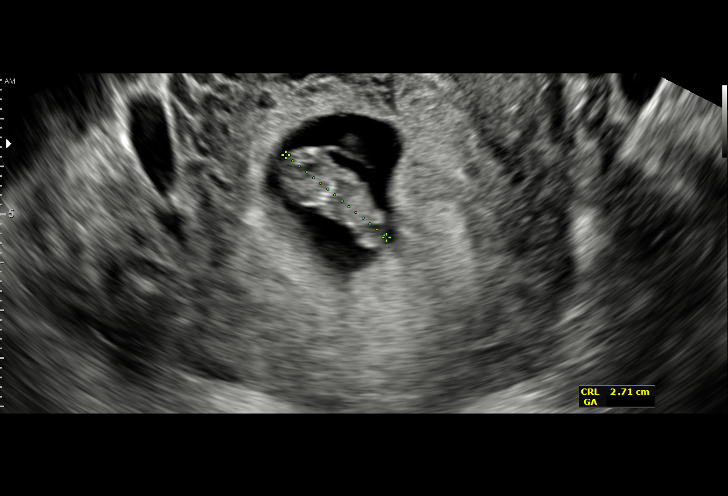
[im 56/66]
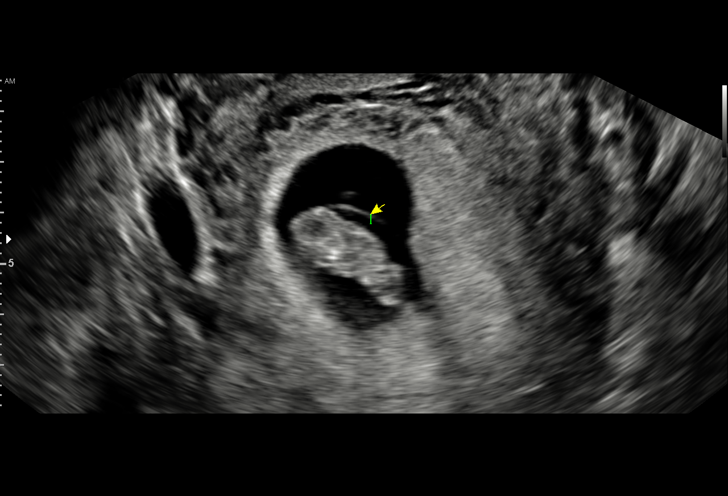
[im 61/66]
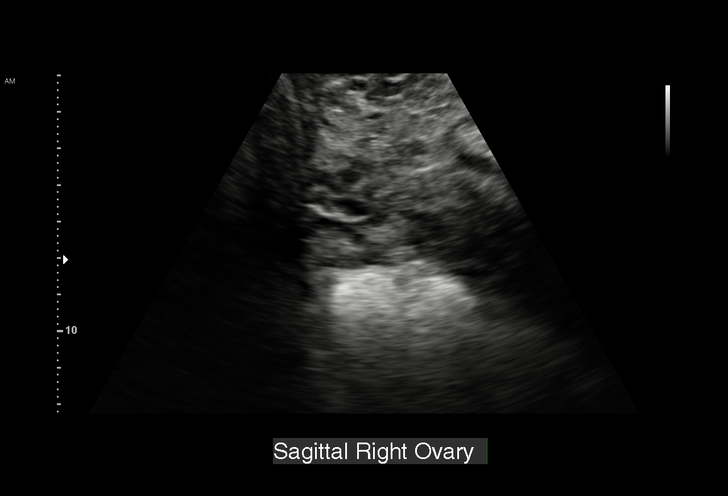
[im 66/66]
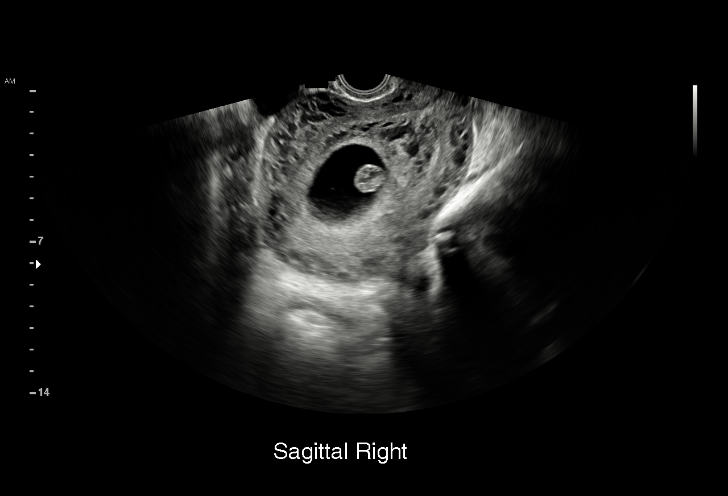

[15 of 28 positions shown; findings below may reference images not displayed]

FINDINGS: Intrauterine gestational sac: Single

Yolk sac:  Visualized

Embryo:  Visualized

Cardiac Activity: Visualized

Heart Rate: 177  bpm

MSD:   mm    w     d

CRL:  28  mm   9 w   4 d                  US EDC: 04/21/2017

Subchorionic hemorrhage:  None visualized.

Maternal uterus/adnexae: No adnexal masses or free fluid.
IMPRESSION: Nine week 4 day intrauterine pregnancy. Fetal heart rate 177 beats
per minute. No acute maternal findings.

## 2018-01-24 ENCOUNTER — Emergency Department: Payer: Self-pay

## 2018-01-24 ENCOUNTER — Other Ambulatory Visit: Payer: Self-pay

## 2018-01-24 ENCOUNTER — Emergency Department
Admission: EM | Admit: 2018-01-24 | Discharge: 2018-01-24 | Disposition: A | Discharge status: Home / Self Care | Payer: BLUE CROSS/BLUE SHIELD | Attending: Emergency Medicine | Admitting: Emergency Medicine

## 2018-01-24 ENCOUNTER — Encounter: Payer: Self-pay | Admitting: Emergency Medicine

## 2018-01-24 DIAGNOSIS — N390 Urinary tract infection, site not specified: Secondary | ICD-10-CM

## 2018-01-24 DIAGNOSIS — R102 Pelvic and perineal pain: Secondary | ICD-10-CM | POA: Insufficient documentation

## 2018-01-24 DIAGNOSIS — J45909 Unspecified asthma, uncomplicated: Secondary | ICD-10-CM | POA: Insufficient documentation

## 2018-01-24 DIAGNOSIS — R300 Dysuria: Secondary | ICD-10-CM | POA: Insufficient documentation

## 2018-01-24 DIAGNOSIS — M545 Low back pain: Secondary | ICD-10-CM | POA: Insufficient documentation

## 2018-01-24 LAB — CBC
HCT: 36.6 % (ref 35.0–47.0)
Hemoglobin: 12.3 g/dL (ref 12.0–16.0)
MCH: 30.9 pg (ref 26.0–34.0)
MCHC: 33.6 g/dL (ref 32.0–36.0)
MCV: 91.7 fL (ref 80.0–100.0)
PLATELETS: 335 10*3/uL (ref 150–440)
RBC: 3.99 MIL/uL (ref 3.80–5.20)
RDW: 16.9 % — AB (ref 11.5–14.5)
WBC: 9.8 10*3/uL (ref 3.6–11.0)

## 2018-01-24 LAB — URINALYSIS, COMPLETE (UACMP) WITH MICROSCOPIC
BILIRUBIN URINE: NEGATIVE
GLUCOSE, UA: NEGATIVE mg/dL
KETONES UR: NEGATIVE mg/dL
NITRITE: NEGATIVE
PH: 6 (ref 5.0–8.0)
Protein, ur: NEGATIVE mg/dL
SPECIFIC GRAVITY, URINE: 1.013 (ref 1.005–1.030)

## 2018-01-24 LAB — COMPREHENSIVE METABOLIC PANEL
ALT: 17 U/L (ref 14–54)
AST: 19 U/L (ref 15–41)
Albumin: 4.5 g/dL (ref 3.5–5.0)
Alkaline Phosphatase: 45 U/L (ref 38–126)
Anion gap: 6 (ref 5–15)
BILIRUBIN TOTAL: 0.6 mg/dL (ref 0.3–1.2)
BUN: 11 mg/dL (ref 6–20)
CHLORIDE: 106 mmol/L (ref 101–111)
CO2: 26 mmol/L (ref 22–32)
CREATININE: 0.57 mg/dL (ref 0.44–1.00)
Calcium: 8.7 mg/dL — ABNORMAL LOW (ref 8.9–10.3)
GFR calc non Af Amer: >60 mL/min (ref >60)
Glucose, Bld: 87 mg/dL (ref 65–99)
Potassium: 4 mmol/L (ref 3.5–5.1)
Sodium: 138 mmol/L (ref 135–145)
Total Protein: 8.1 g/dL (ref 6.5–8.1)

## 2018-01-24 LAB — POCT PREGNANCY, URINE: Preg Test, Ur: NEGATIVE

## 2018-01-24 LAB — LIPASE, BLOOD: LIPASE: 31 U/L (ref 11–51)

## 2018-01-24 MED ORDER — IBUPROFEN 600 MG PO TABS
600.0000 mg | ORAL_TABLET | Freq: Once | ORAL | Status: AC
  Administered 2018-01-24: 600 mg via ORAL
  Filled 2018-01-24: qty 1

## 2018-01-24 MED ORDER — IBUPROFEN 600 MG PO TABS: 600.0000 mg | ORAL_TABLET | Freq: Four times a day (QID) | ORAL | 0 refills | Status: DC | PRN

## 2018-01-24 MED ORDER — CEPHALEXIN 500 MG PO CAPS: 500.0000 mg | ORAL_CAPSULE | Freq: Three times a day (TID) | ORAL | 0 refills | Status: AC

## 2018-01-24 NOTE — ED Notes (Signed)
Pt c/o back pain since Tuesday. Also reports sharp pelvic pain s/p voiding.

## 2018-01-24 NOTE — Discharge Instructions (Signed)
Do have a small kidney stone but it is not likely causing her pain.  If you have increased pain, fevers, chills, vomiting, or you have new or worrisome symptoms please return to the emergency room.  You declined pelvic exam, which is not unreasonable but it does mean we cannot rule out vaginal infection and if you feel worse, or change your mind you really should come back.

## 2018-01-24 NOTE — ED Triage Notes (Signed)
R flank pain and urinary frequency x 4 days.
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
## 2018-02-07 IMAGING — CT CT ANGIO CHEST
2 of 6 series · 18 of 46 positions shown · IV contrast (APPLIED)
Comparison: None.

CLINICAL DATA: Acute onset central chest pain and shortness of
breath. Borderline tachycardia and small EKG changes. Patient is 27
weeks pregnant. Risks were discussed with the patient by the
ordering physician and the patient agreed to the procedure. Patient
was shielded.

EXAM:
CT ANGIOGRAPHY CHEST WITH CONTRAST
TECHNIQUE: Multidetector CT imaging of the chest was performed using the
standard protocol during bolus administration of intravenous
contrast. Multiplanar CT image reconstructions and MIPs were
obtained to evaluate the vascular anatomy.
CONTRAST:  75 mL Isovue 370

[Series 5: thins · axial · 0.69mm/px · z∈[-430,-232]mm · 16 of 218 slices shown]
[im 10/218  lung]
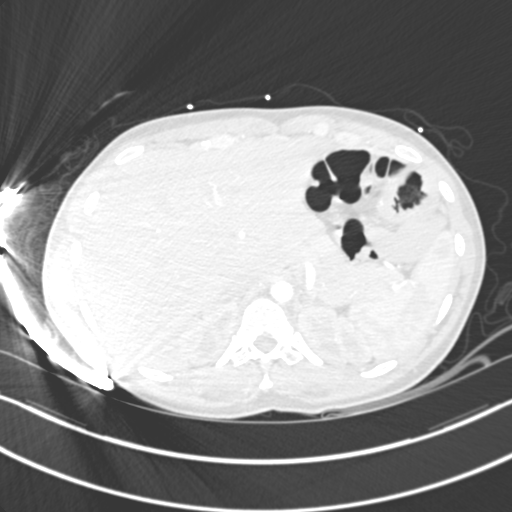
[im 29/218  soft-tissue]
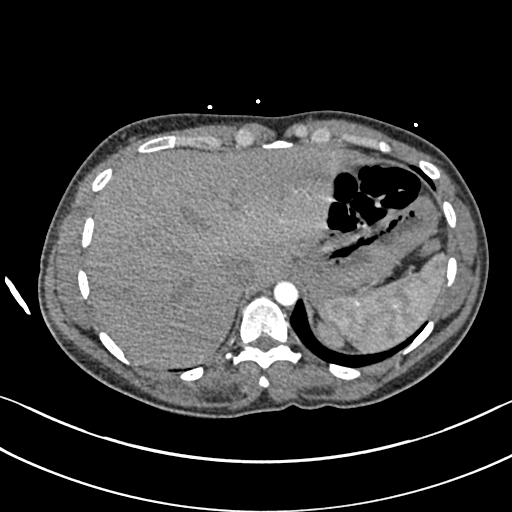
[im 38/218  lung]
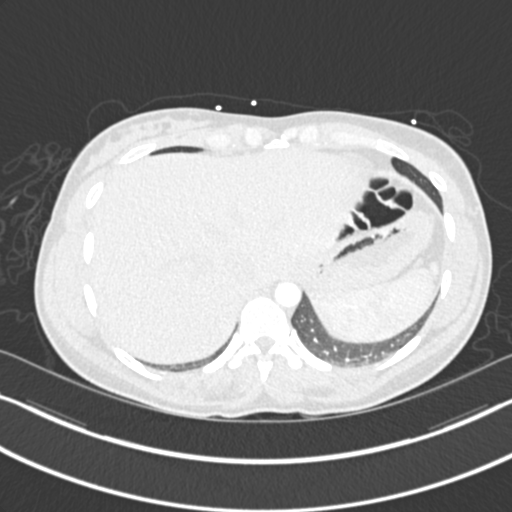
[im 48/218  soft-tissue]
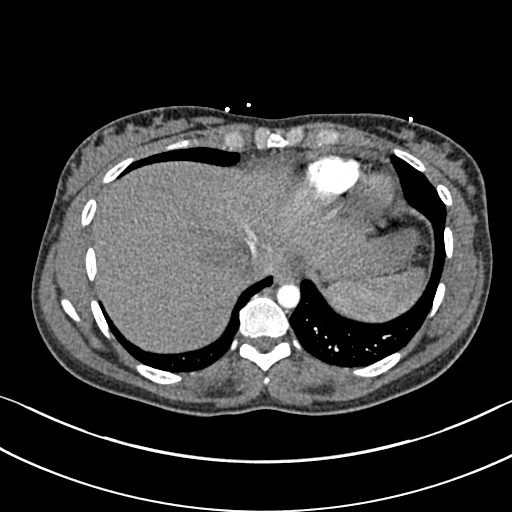
[im 67/218  lung]
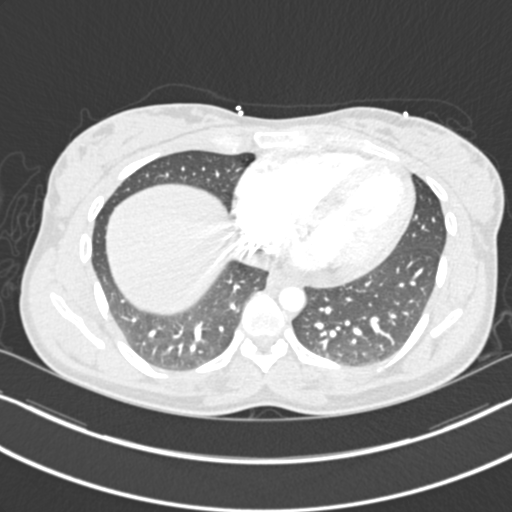
[im 76/218  soft-tissue]
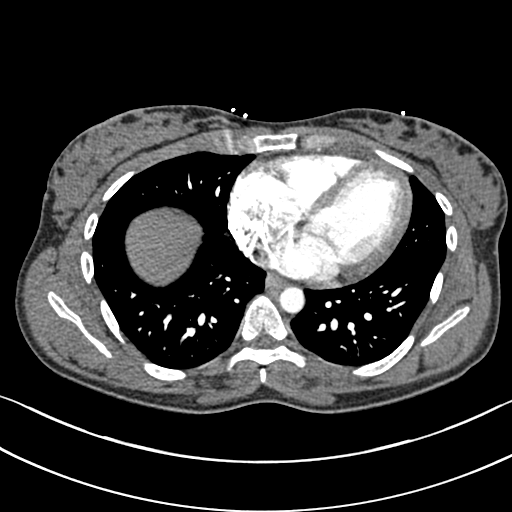
[im 85/218  lung]
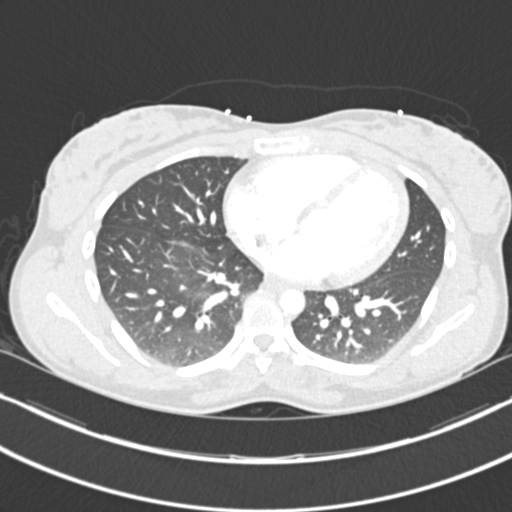
[im 104/218  soft-tissue]
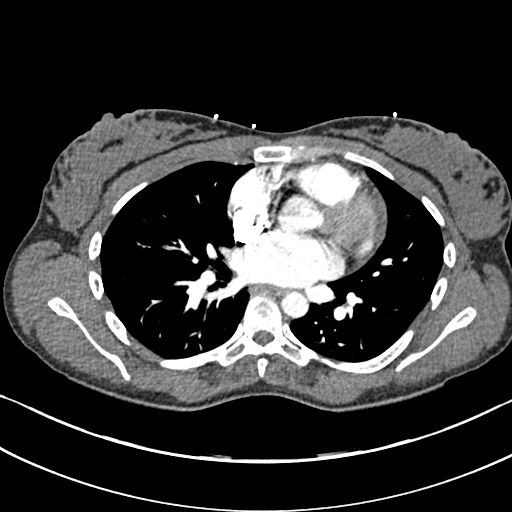
[im 114/218  lung]
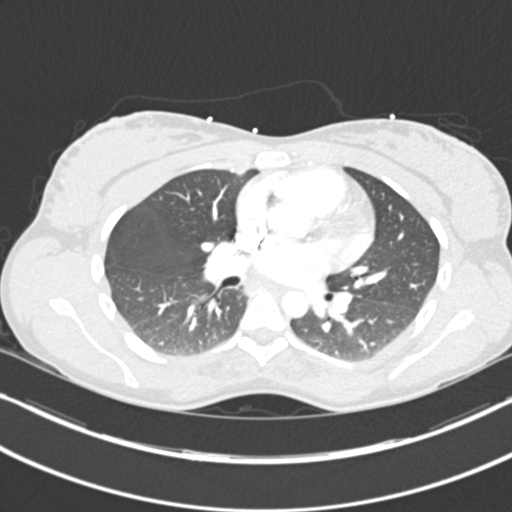
[im 133/218  soft-tissue]
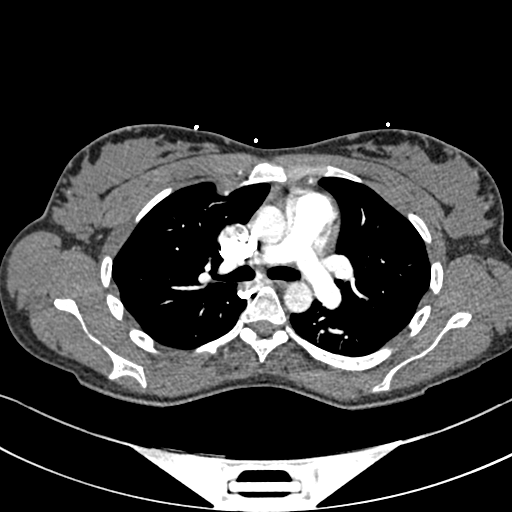
[im 142/218  lung]
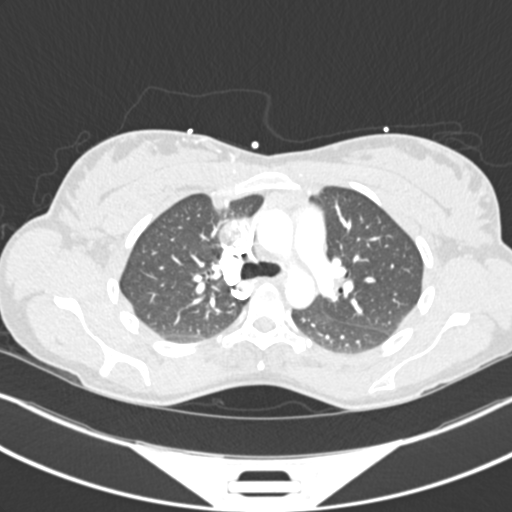
[im 151/218  soft-tissue]
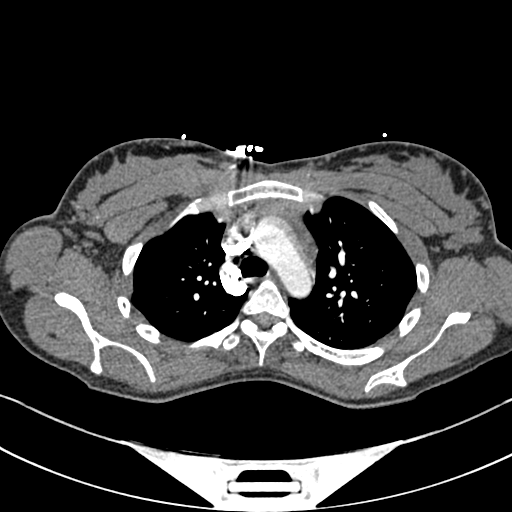
[im 170/218  lung]
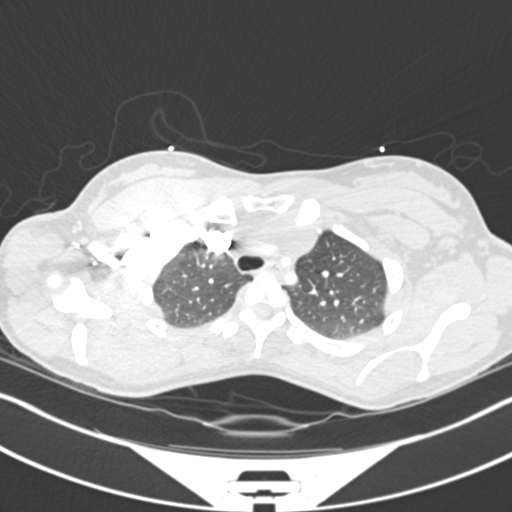
[im 180/218  soft-tissue]
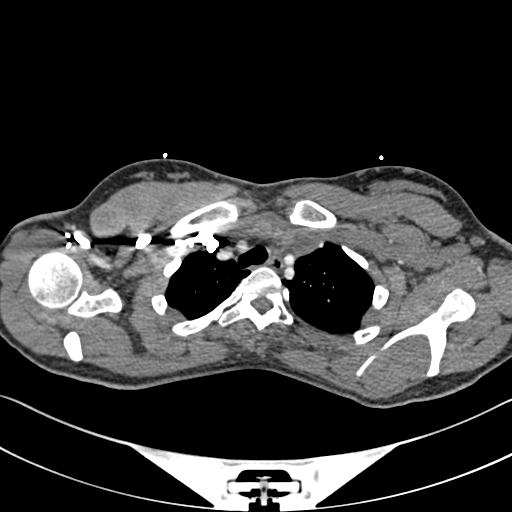
[im 189/218  lung]
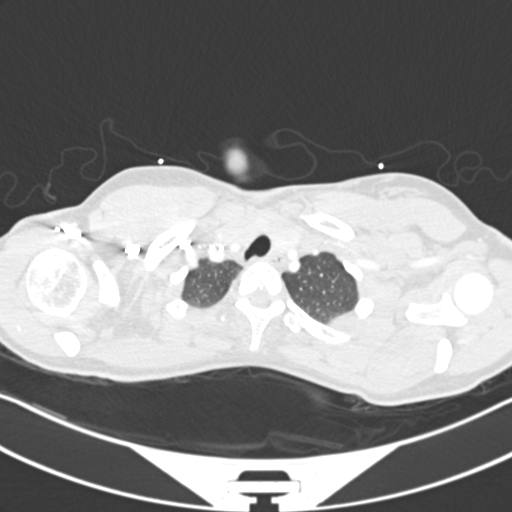
[im 208/218  soft-tissue]
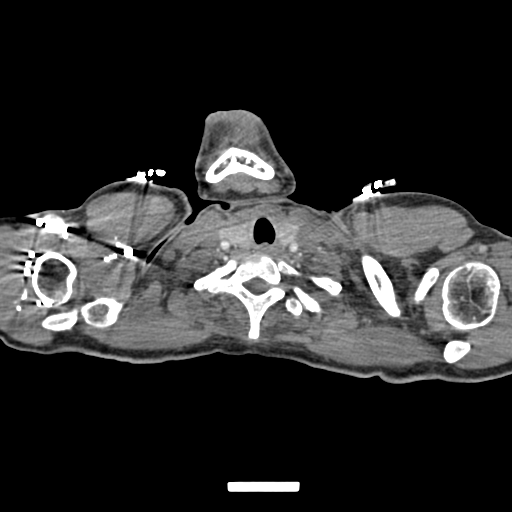

[Series 7: coronal mpr · coronal · 0.45mm/px · 2 of 68 slices shown]
[im 23/68  soft-tissue]
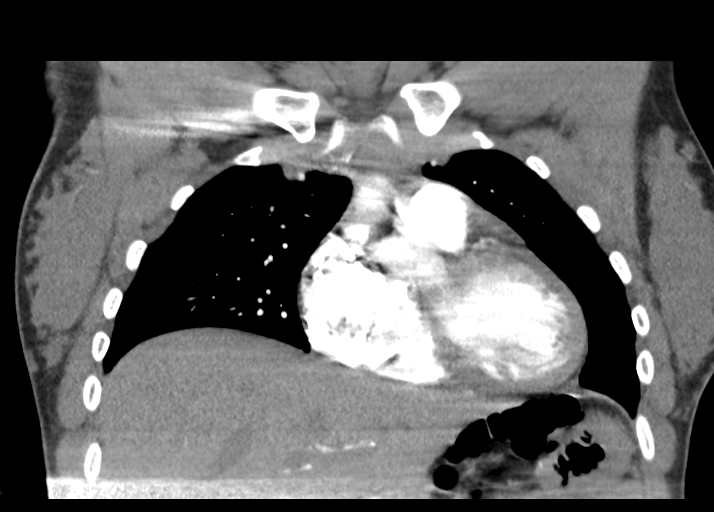
[im 45/68  soft-tissue]
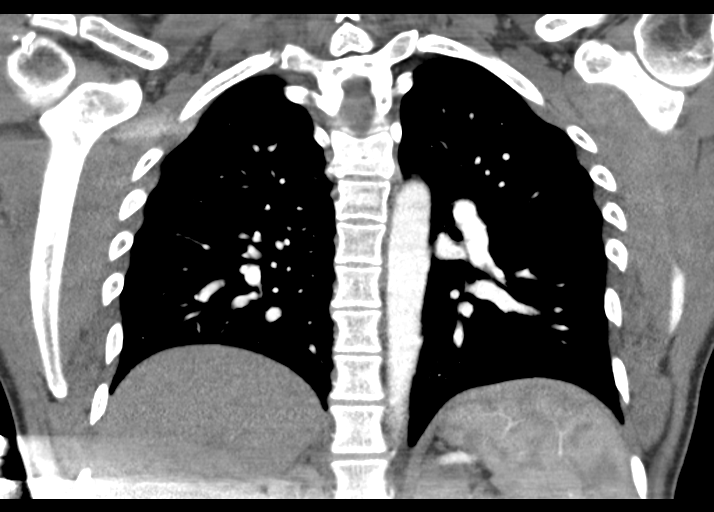

[18 of 46 positions shown; findings below may reference images not displayed]

FINDINGS: Cardiovascular: Good opacification of the central and segmental
pulmonary arteries. No focal filling defects demonstrated. No
evidence of significant pulmonary embolus. Normal heart size. Normal
caliber thoracic aorta. No pericardial effusion.

Mediastinum/Nodes: Residual thymic tissue in the anterior
mediastinum. No significant lymphadenopathy. Esophagus is
decompressed.

Lungs/Pleura: Lungs are clear. No focal airspace disease or
consolidation. No pleural effusions. No pneumothorax. Airways are
patent.

Upper Abdomen: Visualized upper abdominal organs are grossly
unremarkable.

Musculoskeletal: No acute bony abnormalities.

Review of the MIP images confirms the above findings.
IMPRESSION: No evidence of significant pulmonary embolus. No evidence of active
pulmonary disease.

## 2018-09-26 ENCOUNTER — Encounter (HOSPITAL_COMMUNITY): Payer: Self-pay

## 2018-09-26 ENCOUNTER — Other Ambulatory Visit: Payer: Self-pay

## 2018-09-26 ENCOUNTER — Ambulatory Visit (HOSPITAL_COMMUNITY)
Admission: EM | Admit: 2018-09-26 | Discharge: 2018-09-26 | Disposition: A | Discharge status: Home / Self Care | Payer: Self-pay | Attending: Internal Medicine | Admitting: Internal Medicine

## 2018-09-26 DIAGNOSIS — J45909 Unspecified asthma, uncomplicated: Secondary | ICD-10-CM | POA: Insufficient documentation

## 2018-09-26 DIAGNOSIS — B9689 Other specified bacterial agents as the cause of diseases classified elsewhere: Secondary | ICD-10-CM

## 2018-09-26 DIAGNOSIS — N760 Acute vaginitis: Secondary | ICD-10-CM | POA: Insufficient documentation

## 2018-09-26 DIAGNOSIS — Z79899 Other long term (current) drug therapy: Secondary | ICD-10-CM | POA: Insufficient documentation

## 2018-09-26 DIAGNOSIS — Z791 Long term (current) use of non-steroidal anti-inflammatories (NSAID): Secondary | ICD-10-CM | POA: Insufficient documentation

## 2018-09-26 LAB — POCT URINALYSIS DIP (DEVICE)
BILIRUBIN URINE: NEGATIVE
Glucose, UA: NEGATIVE mg/dL
Hgb urine dipstick: NEGATIVE
Ketones, ur: NEGATIVE mg/dL
Nitrite: NEGATIVE
Protein, ur: NEGATIVE mg/dL
Specific Gravity, Urine: >=1.03 (ref 1.005–1.030)
Urobilinogen, UA: 0.2 mg/dL (ref 0.0–1.0)
pH: 6 (ref 5.0–8.0)

## 2018-09-26 MED ORDER — METRONIDAZOLE 500 MG PO TABS: 500.0000 mg | ORAL_TABLET | Freq: Two times a day (BID) | ORAL | 0 refills | Status: DC

## 2018-09-26 NOTE — ED Triage Notes (Signed)
Pt cc she has a white milky vaginal discharge. X 3 days
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
## 2018-09-28 ENCOUNTER — Telehealth (HOSPITAL_COMMUNITY): Payer: Self-pay | Admitting: Emergency Medicine

## 2018-09-28 LAB — CERVICOVAGINAL ANCILLARY ONLY
Bacterial vaginitis: POSITIVE — AB
Candida vaginitis: POSITIVE — AB
Trichomonas: NEGATIVE

## 2018-09-28 MED ORDER — FLUCONAZOLE 150 MG PO TABS: 150.0000 mg | ORAL_TABLET | Freq: Once | ORAL | 0 refills | Status: AC

## 2018-09-28 NOTE — Telephone Encounter (Signed)
Bacterial Vaginosis test is positive.  Prescription for metronidazole was given at the urgent care visit.   Test for candida (yeast) was positive.  Prescription for fluconazole 150mg  po now, repeat dose in 3d if needed, #2 no refills, sent to the pharmacy of record.  Recheck or followup with PCP for further evaluation if symptoms are not improving.    Contacted patient about results. All questions answered.

## 2019-05-06 ENCOUNTER — Emergency Department (HOSPITAL_COMMUNITY): Admission: EM | Admit: 2019-05-06 | Discharge: 2019-05-06 | Discharge status: Against Medical Advice | Payer: Self-pay

## 2019-05-06 ENCOUNTER — Other Ambulatory Visit: Payer: Self-pay

## 2019-05-06 NOTE — ED Notes (Signed)
Pt called for triage, no answer

## 2019-05-06 NOTE — ED Notes (Signed)
Pt left AMA. Pt stated she can't stand the lights in the waiting room because it's affecting her headache. Pt said she would much rather be in her bed.

## 2019-06-09 ENCOUNTER — Emergency Department
Admission: EM | Admit: 2019-06-09 | Discharge: 2019-06-09 | Disposition: A | Discharge status: Home / Self Care | Payer: Managed Care, Other (non HMO) | Attending: Emergency Medicine | Admitting: Emergency Medicine

## 2019-06-09 ENCOUNTER — Other Ambulatory Visit: Payer: Self-pay

## 2019-06-09 DIAGNOSIS — W540XXA Bitten by dog, initial encounter: Secondary | ICD-10-CM | POA: Diagnosis not present

## 2019-06-09 DIAGNOSIS — Z23 Encounter for immunization: Secondary | ICD-10-CM | POA: Diagnosis not present

## 2019-06-09 DIAGNOSIS — J45909 Unspecified asthma, uncomplicated: Secondary | ICD-10-CM | POA: Diagnosis not present

## 2019-06-09 DIAGNOSIS — S71131A Puncture wound without foreign body, right thigh, initial encounter: Secondary | ICD-10-CM | POA: Diagnosis not present

## 2019-06-09 DIAGNOSIS — Y9289 Other specified places as the place of occurrence of the external cause: Secondary | ICD-10-CM | POA: Insufficient documentation

## 2019-06-09 DIAGNOSIS — Y999 Unspecified external cause status: Secondary | ICD-10-CM | POA: Diagnosis not present

## 2019-06-09 DIAGNOSIS — Y939 Activity, unspecified: Secondary | ICD-10-CM | POA: Insufficient documentation

## 2019-06-09 DIAGNOSIS — S76801A Unspecified injury of other specified muscles, fascia and tendons at thigh level, right thigh, initial encounter: Secondary | ICD-10-CM | POA: Diagnosis present

## 2019-06-09 MED ORDER — TETANUS-DIPHTH-ACELL PERTUSSIS 5-2.5-18.5 LF-MCG/0.5 IM SUSP
0.5000 mL | Freq: Once | INTRAMUSCULAR | Status: AC
  Administered 2019-06-09: 11:00:00 0.5 mL via INTRAMUSCULAR
  Filled 2019-06-09: qty 0.5

## 2019-06-09 MED ORDER — CYCLOBENZAPRINE HCL 5 MG PO TABS: 5.0000 mg | ORAL_TABLET | Freq: Three times a day (TID) | ORAL | 0 refills | Status: AC | PRN

## 2019-06-09 MED ORDER — AMOXICILLIN-POT CLAVULANATE 875-125 MG PO TABS: 1.0000 | ORAL_TABLET | Freq: Two times a day (BID) | ORAL | 0 refills | Status: AC

## 2019-06-09 MED ORDER — BACITRACIN-NEOMYCIN-POLYMYXIN 400-5-5000 EX OINT
TOPICAL_OINTMENT | CUTANEOUS | Status: AC
  Administered 2019-06-09: 11:00:00 via TOPICAL
  Filled 2019-06-09: qty 1

## 2019-06-09 MED ORDER — BACITRACIN-NEOMYCIN-POLYMYXIN 400-5-5000 EX OINT
TOPICAL_OINTMENT | Freq: Once | CUTANEOUS | Status: AC
  Administered 2019-06-09: 11:00:00 via TOPICAL

## 2019-06-09 MED ORDER — IBUPROFEN 800 MG PO TABS: 800.0000 mg | ORAL_TABLET | Freq: Three times a day (TID) | ORAL | 0 refills | Status: AC | PRN

## 2019-06-09 MED ORDER — IBUPROFEN 800 MG PO TABS
800.0000 mg | ORAL_TABLET | Freq: Once | ORAL | Status: AC
  Administered 2019-06-09: 11:00:00 800 mg via ORAL
  Filled 2019-06-09: qty 1

## 2019-06-09 MED ORDER — AMOXICILLIN-POT CLAVULANATE 875-125 MG PO TABS
1.0000 | ORAL_TABLET | Freq: Once | ORAL | Status: AC
  Administered 2019-06-09: 1 via ORAL
  Filled 2019-06-09: qty 1

## 2019-06-09 NOTE — ED Notes (Signed)
Spoke with Stage manager A. Chalmer [phone], states the owner stated the dog is up to date on his vaccinations but wanted me to call back in 10 minutes to verify that information first.

## 2019-06-09 NOTE — ED Triage Notes (Signed)
Dog bite right upper thigh sustained this AM. Pt alert and oriented X4, cooperative, RR even and unlabored, color WNL. Pt in NAD. Has been reported to animal control.

## 2019-06-09 NOTE — Discharge Instructions (Signed)
You are being treated for a dog bite to the right thigh. You should take the antibiotic as directed, and the anti-inflammatory and muscle relaxant as needed. Follow-up with Animal Control Officers to determine the dog's status after the 10-day quarantine. Return to any [facility] ED for the required vaccines.
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
## 2023-06-09 ENCOUNTER — Encounter (HOSPITAL_COMMUNITY): Payer: Self-pay

## 2023-06-09 ENCOUNTER — Other Ambulatory Visit: Payer: Self-pay

## 2023-06-09 ENCOUNTER — Emergency Department (HOSPITAL_COMMUNITY)
Admission: EM | Admit: 2023-06-09 | Discharge: 2023-06-09 | Disposition: A | Discharge status: Home / Self Care | Payer: Medicaid Other | Attending: Emergency Medicine | Admitting: Emergency Medicine

## 2023-06-09 DIAGNOSIS — U071 COVID-19: Secondary | ICD-10-CM | POA: Diagnosis not present

## 2023-06-09 DIAGNOSIS — R059 Cough, unspecified: Secondary | ICD-10-CM | POA: Diagnosis present

## 2023-06-09 LAB — RESP PANEL BY RT-PCR (RSV, FLU A&B, COVID)  RVPGX2
Influenza A by PCR: NEGATIVE
Influenza B by PCR: NEGATIVE
Resp Syncytial Virus by PCR: NEGATIVE
SARS Coronavirus 2 by RT PCR: POSITIVE — AB

## 2023-06-09 LAB — GROUP A STREP BY PCR: Group A Strep by PCR: NOT DETECTED

## 2023-06-09 MED ORDER — LIDOCAINE VISCOUS HCL 2 % MT SOLN: 15.0000 mL | OROMUCOSAL | 0 refills | Status: AC | PRN

## 2023-06-09 MED ORDER — LIDOCAINE VISCOUS HCL 2 % MT SOLN
15.0000 mL | Freq: Once | OROMUCOSAL | Status: AC
  Administered 2023-06-09: 15 mL via OROMUCOSAL
  Filled 2023-06-09: qty 15

## 2023-06-09 NOTE — ED Notes (Signed)
Sore throat since this morning.  No other complaints.

## 2023-06-09 NOTE — ED Triage Notes (Signed)
Pt arrives with c/o sore throat that started today. Pt endorses cough and pain when swallowing. Pt denies fever or congestion.

## 2023-06-09 NOTE — Discharge Instructions (Addendum)
You were seen in the ER for evaluation of your sore throat and cough. You have COVID. I recommend taking over the counter cough and cold medication for your symptoms.  I am sending you home on some viscous lidocaine.  Take for your sore throat as needed.  However I do recommend staying well-hydrated drinking plenty of fluids, mainly water.  Make sure your throat stays well-hydrated with things like throat lozenges, cough drops, etc.  For COVID, you are contagious to please make sure you are quarantining.  Of included a work note for you.  If you have any concerns, new or worsening symptoms, please return to your nearest emergency department for evaluation.  Get help right away if: You have trouble breathing or get short of breath. You have pain or pressure in your chest. You cannot speak or move any part of your body. You are confused. Your symptoms get worse. These symptoms may be an emergency. Get help right away. Call 911. Do not wait to see if the symptoms will go away. Do not drive yourself to the hospital.

## 2023-06-09 NOTE — ED Provider Notes (Signed)
[place] EM[facility]GENCY DEPARTMENT AT [facility] Provider Note   CSN: [id number] Arrival date & time: 06/09/23  2048     History Chief Complaint  Patient presents with   Sore Throat    Brittney Ashley is a 37 y.o. female self-reportedly otherwise healthy presents to the emerged from today for evaluation of sore throat and mild cough since this morning.  She denies any sick contacts.  She denies any fever, runny nose, nasal congestion, trouble swallowing.  She still been able to eat and drink.  Denies any headache or any nuchal rigidity.  She denies any chest pain or shortness of breath.  No medications trialed prior to arrival.  She denies any medical or surgical history.  Denies needed medications.  No known drug allergies.  Denies any tobacco, EtOH, or drug use.   Sore Throat Pertinent negatives include no chest pain, no abdominal pain, no headaches and no shortness of breath.       Home Medications Prior to Admission medications   Medication Sig Start Date End Date Taking? Authorizing Provider  cyclobenzaprine (FLEXERIL) 5 MG tablet Take 1 tablet (5 mg total) by mouth 3 (three) times daily as needed. 06/09/19   Menshew, Charlesetta Ivory, PA-C  ibuprofen (ADVIL) 800 MG tablet Take 1 tablet (800 mg total) by mouth every 8 (eight) hours as needed. 06/09/19   Menshew, Charlesetta Ivory, PA-C      Allergies    Patient has no known allergies.    Review of Systems   Review of Systems  Constitutional:  Negative for chills and fever.  HENT:  Positive for sore throat. Negative for congestion, ear pain and rhinorrhea.   Respiratory:  Positive for cough. Negative for shortness of breath.   Cardiovascular:  Negative for chest pain.  Gastrointestinal:  Negative for abdominal pain, nausea and vomiting.  Neurological:  Negative for headaches.    Physical Exam Updated Vital Signs BP 127/79 (BP Location: Right Arm)   Pulse 81   Temp 98.9 F (37.2 C) (Oral)   Resp 19   Wt 57.2 kg    SpO2 100%   BMI 21.63 kg/m  Physical Exam Vitals and nursing note reviewed.  Constitutional:      General: She is not in acute distress.    Appearance: She is not ill-appearing or toxic-appearing.  HENT:     Head: Normocephalic and atraumatic.     Right Ear: Tympanic membrane, ear canal and external ear normal.     Left Ear: Tympanic membrane, ear canal and external ear normal.     Nose: Nose normal.     Mouth/Throat:     Mouth: Mucous membranes are moist.     Comments: No pharyngeal erythema, exudate, or edema noted.  Uvula midline.  Airway patent.  Moist mucous membranes.  Controlling secretions.  No sublingual elevation.  Normal speech. Eyes:     General: No scleral icterus.    Conjunctiva/sclera: Conjunctivae normal.  Cardiovascular:     Rate and Rhythm: Normal rate.  Pulmonary:     Effort: Pulmonary effort is normal. No respiratory distress.  Abdominal:     General: Abdomen is flat.     Palpations: Abdomen is soft.  Musculoskeletal:        General: No deformity.     Cervical back: Normal range of motion.  Skin:    Findings: No rash.  Neurological:     General: No focal deficit present.     Mental Status: She is  alert.     ED Results / Procedures / Treatments   Labs (all labs ordered are listed, but only abnormal results are displayed) Labs Reviewed  RESP PANEL BY RT-PCR (RSV, FLU A&B, COVID)  RVPGX2 - Abnormal; Notable for the following components:      Result Value   SARS Coronavirus 2 by RT PCR POSITIVE (*)    All other components within normal limits  GROUP A STREP BY PCR    EKG None  Radiology No results found.  Procedures Procedures   Medications Ordered in ED Medications  lidocaine (XYLOCAINE) 2 % viscous mouth solution 15 mL (has no administration in time range)    ED Course/ Medical Decision Making/ A&P                              Medical Decision Making Risk Prescription drug management.   37 y.o. female presents to the ER for  evaluation of sore throat. Differential diagnosis includes but is not limited to Viral pharyngitis, strep pharyngitis, dental caries/abscess, esophagitis, sinusitis, post nasal drip, reflux, angioedema, RTA/PTA, Ludwig's angina. Vital signs unremarkable. Physical exam as noted above.   I independently reviewed and interpreted the patient's labs. Strep negative. COVID positive.  Posterior pharynx does show some cobblestoning.  There is no pharyngeal erythema, edema, or exudate noted.  I do not appreciate any tonsillar hypertrophy.  Airway is patent.  Uvula midline.  No sublingual elevation.  Controlling secretions.  Normal speech.  Lungs are clear to auscultation bilaterally.  I doubt any peritonsillar abscess or any retropharyngeal abscess.  She is likely symptoms from COVID.  Her vital signs are stable.  Will recommend over-the-counter cough and cold medication.  Stable for discharge home.  We discussed the results of the labs/imaging. The plan is supportive care, stay well-hydrated. We discussed strict return precautions and red flag symptoms. The patient verbalized their understanding and agrees to the plan. The patient is stable and being discharged home in good condition.  Portions of this report may have been transcribed using voice recognition software. Every effort was made to ensure accuracy; however, inadvertent computerized transcription errors may be present.   Final Clinical Impression(s) / ED Diagnoses Final diagnoses:  COVID    Rx / DC Orders ED Discharge Orders     None         Achille Rich, PA-C 06/09/23 2246    Glyn Ade, MD 06/10/23 [phone]

## 2023-06-10 ENCOUNTER — Other Ambulatory Visit: Payer: Self-pay | Admitting: Podiatry

## 2023-06-10 DIAGNOSIS — M79672 Pain in left foot: Secondary | ICD-10-CM

## 2023-06-19 ENCOUNTER — Ambulatory Visit
Admission: RE | Admit: 2023-06-19 | Discharge: 2023-06-19 | Disposition: A | Discharge status: Home / Self Care | Payer: Medicaid Other | Source: Ambulatory Visit | Attending: Podiatry | Admitting: Podiatry

## 2023-06-19 DIAGNOSIS — M79672 Pain in left foot: Secondary | ICD-10-CM
